# Patient Record
Sex: Male | Born: 1950
Health system: Southern US, Community
[De-identification: ages and names within clinical notes are randomized; demographics above are authoritative.]

## PROBLEM LIST (undated history)

## (undated) DIAGNOSIS — G4730 Sleep apnea, unspecified: Secondary | ICD-10-CM

## (undated) DIAGNOSIS — K635 Polyp of colon: Secondary | ICD-10-CM

## (undated) DIAGNOSIS — I4891 Unspecified atrial fibrillation: Secondary | ICD-10-CM

## (undated) DIAGNOSIS — K5010 Crohn's disease of large intestine without complications: Secondary | ICD-10-CM

## (undated) DIAGNOSIS — Z862 Personal history of diseases of the blood and blood-forming organs and certain disorders involving the immune mechanism: Secondary | ICD-10-CM

## (undated) DIAGNOSIS — K5790 Diverticulosis of intestine, part unspecified, without perforation or abscess without bleeding: Secondary | ICD-10-CM

## (undated) HISTORY — PX: FINGER SURGERY: SHX640

## (undated) HISTORY — DX: Diverticulosis of intestine, part unspecified, without perforation or abscess without bleeding: K57.90

## (undated) HISTORY — DX: Unspecified atrial fibrillation: I48.91

## (undated) HISTORY — DX: Polyp of colon: K63.5

## (undated) HISTORY — DX: Crohn's disease of large intestine without complications: K50.10

## (undated) HISTORY — PX: COLONOSCOPY: SHX174

## (undated) HISTORY — DX: Sleep apnea, unspecified: G47.30

## (undated) HISTORY — DX: Personal history of diseases of the blood and blood-forming organs and certain disorders involving the immune mechanism: Z86.2

---

## 1999-06-13 ENCOUNTER — Encounter (HOSPITAL_COMMUNITY): Admission: RE | Admit: 1999-06-13 | Discharge: 1999-09-11 | Payer: Self-pay | Admitting: Internal Medicine

## 1999-06-15 ENCOUNTER — Ambulatory Visit (HOSPITAL_COMMUNITY): Admission: RE | Admit: 1999-06-15 | Discharge: 1999-06-15 | Payer: Self-pay | Admitting: Internal Medicine

## 1999-06-15 ENCOUNTER — Encounter (INDEPENDENT_AMBULATORY_CARE_PROVIDER_SITE_OTHER): Payer: Self-pay | Admitting: Specialist

## 1999-06-15 ENCOUNTER — Encounter: Payer: Self-pay | Admitting: Internal Medicine

## 1999-06-30 ENCOUNTER — Encounter (INDEPENDENT_AMBULATORY_CARE_PROVIDER_SITE_OTHER): Payer: Self-pay | Admitting: Specialist

## 1999-06-30 ENCOUNTER — Inpatient Hospital Stay (HOSPITAL_COMMUNITY): Admission: EM | Admit: 1999-06-30 | Discharge: 1999-07-20 | Payer: Self-pay | Admitting: Emergency Medicine

## 1999-06-30 ENCOUNTER — Encounter: Payer: Self-pay | Admitting: Internal Medicine

## 1999-06-30 ENCOUNTER — Encounter (INDEPENDENT_AMBULATORY_CARE_PROVIDER_SITE_OTHER): Payer: Self-pay | Admitting: *Deleted

## 1999-07-01 ENCOUNTER — Encounter: Payer: Self-pay | Admitting: Internal Medicine

## 1999-07-10 ENCOUNTER — Encounter: Payer: Self-pay | Admitting: Internal Medicine

## 1999-07-13 ENCOUNTER — Encounter: Payer: Self-pay | Admitting: Internal Medicine

## 1999-07-14 ENCOUNTER — Encounter: Payer: Self-pay | Admitting: Internal Medicine

## 1999-08-19 ENCOUNTER — Ambulatory Visit (HOSPITAL_COMMUNITY): Admission: RE | Admit: 1999-08-19 | Discharge: 1999-08-19 | Payer: Self-pay | Admitting: Cardiology

## 1999-10-05 ENCOUNTER — Encounter: Payer: Self-pay | Admitting: Cardiology

## 1999-10-05 ENCOUNTER — Ambulatory Visit (HOSPITAL_COMMUNITY): Admission: RE | Admit: 1999-10-05 | Discharge: 1999-10-05 | Payer: Self-pay | Admitting: Cardiology

## 2000-12-26 ENCOUNTER — Encounter: Payer: Self-pay | Admitting: Internal Medicine

## 2000-12-26 ENCOUNTER — Encounter: Payer: Self-pay | Admitting: Gastroenterology

## 2000-12-26 ENCOUNTER — Observation Stay (HOSPITAL_COMMUNITY): Admission: EM | Admit: 2000-12-26 | Discharge: 2000-12-27 | Payer: Self-pay | Admitting: Emergency Medicine

## 2001-05-20 ENCOUNTER — Encounter (HOSPITAL_COMMUNITY): Admission: RE | Admit: 2001-05-20 | Discharge: 2001-08-18 | Payer: Self-pay | Admitting: Internal Medicine

## 2001-06-28 ENCOUNTER — Ambulatory Visit (HOSPITAL_COMMUNITY): Admission: RE | Admit: 2001-06-28 | Discharge: 2001-06-28 | Payer: Self-pay | Admitting: Internal Medicine

## 2001-09-25 ENCOUNTER — Encounter (HOSPITAL_COMMUNITY): Admission: RE | Admit: 2001-09-25 | Discharge: 2001-11-14 | Payer: Self-pay | Admitting: Internal Medicine

## 2001-12-19 ENCOUNTER — Encounter (HOSPITAL_COMMUNITY): Admission: RE | Admit: 2001-12-19 | Discharge: 2001-12-19 | Payer: Self-pay | Admitting: Internal Medicine

## 2002-01-30 ENCOUNTER — Encounter: Admission: RE | Admit: 2002-01-30 | Discharge: 2002-04-30 | Payer: Self-pay | Admitting: Internal Medicine

## 2002-06-04 ENCOUNTER — Encounter (HOSPITAL_COMMUNITY): Admission: RE | Admit: 2002-06-04 | Discharge: 2002-06-05 | Payer: Self-pay | Admitting: Internal Medicine

## 2002-07-31 ENCOUNTER — Encounter: Admission: RE | Admit: 2002-07-31 | Discharge: 2002-07-31 | Payer: Self-pay | Admitting: Internal Medicine

## 2002-09-17 ENCOUNTER — Encounter (HOSPITAL_COMMUNITY): Admission: RE | Admit: 2002-09-17 | Discharge: 2002-12-16 | Payer: Self-pay | Admitting: Internal Medicine

## 2002-09-23 ENCOUNTER — Encounter: Payer: Self-pay | Admitting: Internal Medicine

## 2003-01-06 ENCOUNTER — Encounter (HOSPITAL_COMMUNITY): Admission: RE | Admit: 2003-01-06 | Discharge: 2003-01-07 | Payer: Self-pay | Admitting: Internal Medicine

## 2003-03-09 ENCOUNTER — Encounter (HOSPITAL_COMMUNITY): Admission: RE | Admit: 2003-03-09 | Discharge: 2003-06-07 | Payer: Self-pay | Admitting: Internal Medicine

## 2003-07-21 ENCOUNTER — Encounter (HOSPITAL_COMMUNITY): Admission: RE | Admit: 2003-07-21 | Discharge: 2003-07-22 | Payer: Self-pay | Admitting: Internal Medicine

## 2003-09-15 ENCOUNTER — Encounter (HOSPITAL_COMMUNITY): Admission: RE | Admit: 2003-09-15 | Discharge: 2003-11-13 | Payer: Self-pay | Admitting: Internal Medicine

## 2003-11-17 ENCOUNTER — Encounter (HOSPITAL_COMMUNITY): Admission: RE | Admit: 2003-11-17 | Discharge: 2004-02-13 | Payer: Self-pay | Admitting: Internal Medicine

## 2004-02-16 ENCOUNTER — Encounter (HOSPITAL_COMMUNITY): Admission: RE | Admit: 2004-02-16 | Discharge: 2004-05-16 | Payer: Self-pay | Admitting: Internal Medicine

## 2004-02-29 ENCOUNTER — Ambulatory Visit: Payer: Self-pay | Admitting: Internal Medicine

## 2004-03-09 ENCOUNTER — Ambulatory Visit: Payer: Self-pay | Admitting: Internal Medicine

## 2004-07-05 ENCOUNTER — Encounter (HOSPITAL_COMMUNITY): Admission: RE | Admit: 2004-07-05 | Discharge: 2004-07-06 | Payer: Self-pay | Admitting: Internal Medicine

## 2004-08-31 ENCOUNTER — Encounter (HOSPITAL_COMMUNITY): Admission: RE | Admit: 2004-08-31 | Discharge: 2004-11-29 | Payer: Self-pay | Admitting: Internal Medicine

## 2004-11-01 ENCOUNTER — Ambulatory Visit: Payer: Self-pay | Admitting: Internal Medicine

## 2004-11-08 ENCOUNTER — Ambulatory Visit: Payer: Self-pay | Admitting: Internal Medicine

## 2004-11-08 ENCOUNTER — Encounter (INDEPENDENT_AMBULATORY_CARE_PROVIDER_SITE_OTHER): Payer: Self-pay | Admitting: Specialist

## 2005-01-06 ENCOUNTER — Encounter (HOSPITAL_COMMUNITY): Admission: RE | Admit: 2005-01-06 | Discharge: 2005-04-06 | Payer: Self-pay | Admitting: Internal Medicine

## 2005-04-28 ENCOUNTER — Encounter (HOSPITAL_COMMUNITY): Admission: RE | Admit: 2005-04-28 | Discharge: 2005-07-27 | Payer: Self-pay | Admitting: Internal Medicine

## 2005-05-16 ENCOUNTER — Ambulatory Visit: Payer: Self-pay | Admitting: Internal Medicine

## 2005-09-11 ENCOUNTER — Encounter (HOSPITAL_COMMUNITY): Admission: RE | Admit: 2005-09-11 | Discharge: 2005-12-10 | Payer: Self-pay | Admitting: Internal Medicine

## 2006-01-16 ENCOUNTER — Encounter (HOSPITAL_COMMUNITY): Admission: RE | Admit: 2006-01-16 | Discharge: 2006-04-16 | Payer: Self-pay | Admitting: Internal Medicine

## 2006-04-27 ENCOUNTER — Encounter (HOSPITAL_COMMUNITY): Admission: RE | Admit: 2006-04-27 | Discharge: 2006-05-11 | Payer: Self-pay | Admitting: Internal Medicine

## 2006-06-21 ENCOUNTER — Encounter (HOSPITAL_COMMUNITY): Admission: RE | Admit: 2006-06-21 | Discharge: 2006-07-14 | Payer: Self-pay | Admitting: Internal Medicine

## 2006-12-18 ENCOUNTER — Ambulatory Visit: Payer: Self-pay | Admitting: Internal Medicine

## 2006-12-28 ENCOUNTER — Ambulatory Visit: Payer: Self-pay | Admitting: Internal Medicine

## 2006-12-28 ENCOUNTER — Encounter: Payer: Self-pay | Admitting: Internal Medicine

## 2007-06-20 ENCOUNTER — Ambulatory Visit: Payer: Self-pay | Admitting: Internal Medicine

## 2007-07-11 ENCOUNTER — Telehealth: Payer: Self-pay | Admitting: Internal Medicine

## 2008-03-02 ENCOUNTER — Encounter (INDEPENDENT_AMBULATORY_CARE_PROVIDER_SITE_OTHER): Payer: Self-pay | Admitting: *Deleted

## 2008-05-06 ENCOUNTER — Telehealth: Payer: Self-pay | Admitting: Internal Medicine

## 2008-08-18 ENCOUNTER — Encounter: Payer: Self-pay | Admitting: Internal Medicine

## 2008-09-16 ENCOUNTER — Telehealth: Payer: Self-pay | Admitting: Internal Medicine

## 2008-10-07 ENCOUNTER — Ambulatory Visit: Payer: Self-pay | Admitting: Internal Medicine

## 2008-10-07 DIAGNOSIS — K50119 Crohn's disease of large intestine with unspecified complications: Secondary | ICD-10-CM | POA: Insufficient documentation

## 2008-10-08 ENCOUNTER — Ambulatory Visit: Payer: Self-pay | Admitting: Internal Medicine

## 2008-10-08 ENCOUNTER — Encounter: Payer: Self-pay | Admitting: Internal Medicine

## 2008-10-12 ENCOUNTER — Encounter: Payer: Self-pay | Admitting: Internal Medicine

## 2008-10-23 ENCOUNTER — Telehealth (INDEPENDENT_AMBULATORY_CARE_PROVIDER_SITE_OTHER): Payer: Self-pay | Admitting: *Deleted

## 2008-11-12 ENCOUNTER — Telehealth: Payer: Self-pay | Admitting: Internal Medicine

## 2008-11-25 ENCOUNTER — Telehealth: Payer: Self-pay | Admitting: Internal Medicine

## 2008-11-26 ENCOUNTER — Telehealth (INDEPENDENT_AMBULATORY_CARE_PROVIDER_SITE_OTHER): Payer: Self-pay | Admitting: *Deleted

## 2008-12-10 ENCOUNTER — Encounter: Payer: Self-pay | Admitting: Internal Medicine

## 2008-12-23 ENCOUNTER — Telehealth: Payer: Self-pay | Admitting: Internal Medicine

## 2009-02-04 ENCOUNTER — Encounter: Payer: Self-pay | Admitting: Internal Medicine

## 2009-02-18 ENCOUNTER — Telehealth: Payer: Self-pay | Admitting: Internal Medicine

## 2009-04-08 ENCOUNTER — Encounter: Payer: Self-pay | Admitting: Internal Medicine

## 2009-05-24 ENCOUNTER — Telehealth: Payer: Self-pay | Admitting: Internal Medicine

## 2009-05-24 ENCOUNTER — Encounter: Payer: Self-pay | Admitting: Internal Medicine

## 2009-05-25 ENCOUNTER — Ambulatory Visit: Payer: Self-pay | Admitting: Internal Medicine

## 2009-06-03 ENCOUNTER — Encounter: Payer: Self-pay | Admitting: Internal Medicine

## 2009-06-03 LAB — CONVERTED CEMR LAB
ALT: 22 units/L (ref 0–53)
AST: 18 units/L (ref 0–37)
Albumin: 4.1 g/dL (ref 3.5–5.2)
Alkaline Phosphatase: 71 units/L (ref 39–117)
BUN: 13 mg/dL (ref 6–23)
Basophils Absolute: 0 10*3/uL (ref 0.0–0.1)
Basophils Relative: 0.9 % (ref 0.0–3.0)
Bilirubin, Direct: 0.1 mg/dL (ref 0.0–0.3)
CO2: 29 meq/L (ref 19–32)
Calcium: 9.1 mg/dL (ref 8.4–10.5)
Chloride: 107 meq/L (ref 96–112)
Creatinine, Ser: 0.8 mg/dL (ref 0.4–1.5)
Eosinophils Absolute: 0.1 10*3/uL (ref 0.0–0.7)
Eosinophils Relative: 2.5 % (ref 0.0–5.0)
GFR calc non Af Amer: 105.94 mL/min (ref >60)
Glucose, Bld: 85 mg/dL (ref 70–99)
HCT: 42 % (ref 39.0–52.0)
Hemoglobin: 14.5 g/dL (ref 13.0–17.0)
Lymphocytes Relative: 35.6 % (ref 12.0–46.0)
Lymphs Abs: 1.8 10*3/uL (ref 0.7–4.0)
MCHC: 34.5 g/dL (ref 30.0–36.0)
MCV: 91.1 fL (ref 78.0–100.0)
Monocytes Absolute: 0.7 10*3/uL (ref 0.1–1.0)
Monocytes Relative: 13.2 % — ABNORMAL HIGH (ref 3.0–12.0)
Neutro Abs: 2.5 10*3/uL (ref 1.4–7.7)
Neutrophils Relative %: 47.8 % (ref 43.0–77.0)
Platelets: 313 10*3/uL (ref 150.0–400.0)
Potassium: 4.2 meq/L (ref 3.5–5.1)
RBC: 4.61 M/uL (ref 4.22–5.81)
RDW: 13.2 % (ref 11.5–14.6)
Sodium: 142 meq/L (ref 135–145)
Total Bilirubin: 0.8 mg/dL (ref 0.3–1.2)
Total Protein: 7.1 g/dL (ref 6.0–8.3)
WBC: 5.2 10*3/uL (ref 4.5–10.5)

## 2009-06-17 ENCOUNTER — Encounter: Payer: Self-pay | Admitting: Internal Medicine

## 2009-07-29 ENCOUNTER — Encounter: Payer: Self-pay | Admitting: Internal Medicine

## 2009-09-21 ENCOUNTER — Encounter: Payer: Self-pay | Admitting: Internal Medicine

## 2009-09-21 ENCOUNTER — Telehealth (INDEPENDENT_AMBULATORY_CARE_PROVIDER_SITE_OTHER): Payer: Self-pay | Admitting: *Deleted

## 2009-09-23 ENCOUNTER — Encounter: Payer: Self-pay | Admitting: Internal Medicine

## 2009-10-13 ENCOUNTER — Encounter: Payer: Self-pay | Admitting: Internal Medicine

## 2009-10-13 ENCOUNTER — Telehealth: Payer: Self-pay | Admitting: Internal Medicine

## 2009-10-14 ENCOUNTER — Encounter: Payer: Self-pay | Admitting: Internal Medicine

## 2009-11-18 ENCOUNTER — Encounter: Payer: Self-pay | Admitting: Internal Medicine

## 2009-11-24 ENCOUNTER — Ambulatory Visit: Payer: Self-pay | Admitting: Internal Medicine

## 2010-01-13 ENCOUNTER — Encounter: Payer: Self-pay | Admitting: Internal Medicine

## 2010-03-04 ENCOUNTER — Encounter: Payer: Self-pay | Admitting: Internal Medicine

## 2010-03-07 ENCOUNTER — Telehealth (INDEPENDENT_AMBULATORY_CARE_PROVIDER_SITE_OTHER): Payer: Self-pay | Admitting: *Deleted

## 2010-03-10 ENCOUNTER — Encounter: Payer: Self-pay | Admitting: Internal Medicine

## 2010-03-15 NOTE — Miscellaneous (Signed)
Summary: TB Skin Test Reading  Clinical Lists Changes  Observations: Added new observation of TB-PPD: 09/23/09 41m/neg. (10/13/2009 16:45)

## 2010-03-15 NOTE — Progress Notes (Signed)
   Phone Note Outgoing Call   Call placed by: Randye Lobo [place],  October 13, 2009 4:06 PM Call placed to: Patient Summary of Call: Called patient to let him know we did not receive the TB Skin Test results yet.  He stated he would call [facility] one more time and if not satisfied, will personally go to them and pick it up and drop it off himself.   Initial call taken by: Randye Lobo NCMA,  October 13, 2009 4:08 PM

## 2010-03-15 NOTE — Op Note (Signed)
Summary: [facility]  [facility]   Imported By: Phillis Knack 06/11/2009 09:22:09  _____________________________________________________________________  External Attachment:    Type:   Image     Comment:   External Document

## 2010-03-15 NOTE — Letter (Signed)
Summary: [place]   Imported By: Phillis Knack 11/30/2009 10:51:43  _____________________________________________________________________  External Attachment:    Type:   Image     Comment:   External Document

## 2010-03-15 NOTE — Progress Notes (Signed)
Summary: enrollment form   Phone Note Call from Patient Call back at Home Phone [phone]   Caller: Patient Call For: Dr. Henrene Pastor Reason for Call: Talk to Nurse Summary of Call: pt would like a [facility] enrollment application filled out by Dr. Henrene Pastor Initial call taken by: Lucien Mons,  May 24, 2009 10:22 AM  Follow-up for Phone Call        Pt. is going to bring Remi-start form tomorrow to be signed by Dr.Neill Jurewicz. He has not had any blood work since starting Remicade at [facility].Will get it drawn tomorrow if you want him to have it done. Follow-up by: Abel Presto RN,  May 24, 2009 10:45 AM  Additional Follow-up for Phone Call Additional follow up Details #1::        cbc, cmet Additional Follow-up by: Irene Shipper MD,  May 24, 2009 11:45 AM    Additional Follow-up for Phone Call Additional follow up Details #2::    Pt. will have labs drawn tomorrow. Follow-up by: Abel Presto RN,  May 24, 2009 1:52 PM

## 2010-03-15 NOTE — Op Note (Signed)
Summary: [facility]  [facility]   Imported By: Phillis Knack 06/11/2009 09:21:23  _____________________________________________________________________  External Attachment:    Type:   Image     Comment:   External Document

## 2010-03-15 NOTE — Progress Notes (Signed)
   Phone Note Outgoing Call   Summary of Call: Pt. ntfd. that yearly TB test for Remicade is due on 10/07/09 .He is going to ask them to do it at [facility] and if they can't do it he will call back her  to schedule it. Initial call taken by: Abel Presto RN,  September 21, 2009 8:50 AM

## 2010-03-15 NOTE — Op Note (Signed)
Summary: [facility]  [facility]   Imported By: Phillis Knack 06/11/2009 09:19:19  _____________________________________________________________________  External Attachment:    Type:   Image     Comment:   External Document

## 2010-03-15 NOTE — Miscellaneous (Signed)
Summary: PPD reading  Clinical Lists Changes  Observations: Added new observation of TB-PPD: 09/23/09 (10/14/2009 7:59)  Appended Document: PPD reading nxt due 09/14/10

## 2010-03-15 NOTE — Op Note (Signed)
Summary: [facility]  [facility]   Imported By: Phillis Knack 06/11/2009 09:20:26  _____________________________________________________________________  External Attachment:    Type:   Image     Comment:   External Document

## 2010-03-15 NOTE — Op Note (Signed)
Summary: [facility]  [facility]   Imported By: Phillis Knack 08/05/2009 08:56:59  _____________________________________________________________________  External Attachment:    Type:   Image     Comment:   External Document

## 2010-03-15 NOTE — Op Note (Signed)
Summary: [facility]  [facility]   Imported By: Phillis Knack 10/01/2009 14:38:22  _____________________________________________________________________  External Attachment:    Type:   Image     Comment:   External Document

## 2010-03-15 NOTE — Op Note (Signed)
Summary: [facility]ade/[facility] [place] By: Phillis Knack 01/17/2010 14:25:07  _____________________________________________________________________  External Attachment:    Type:   Image     Comment:   External Document

## 2010-03-15 NOTE — Op Note (Signed)
Summary: [facility] [place] By: Phillis Knack 11/30/2009 10:49:28  _____________________________________________________________________  External Attachment:    Type:   Image     Comment:   External Document

## 2010-03-15 NOTE — Letter (Deleted)
Summary: Enrollment form for Remicade/[facility]  Enrollment form for Remicade/[facility]   Imported By: Phillis Knack 05/31/2009 09:19:40  _____________________________________________________________________  External Attachment:    Type:   Image     Comment:   External Document

## 2010-03-15 NOTE — Progress Notes (Signed)
Summary: speak to nurse   Phone Note Call from Patient Call back at Home Phone [phone]   Caller: Patient Call For: Henrene Pastor Reason for Call: Talk to Nurse Summary of Call: Patient needs to speak directly to nurse Initial call taken by: Ronalee Red,  February 18, 2009 12:51 PM  Follow-up for Phone Call        Pt. is trying to get his rebate from [facility] and they will be sending a form that Dr.Perry needs to complete .Will send to Sylvia as I will be out of the office until next Tuesday. Follow-up by: Abel Presto RN,  February 18, 2009 2:36 PM    Additional Follow-up for Phone Call Additional follow up Details #2::    Form rec'd from [facility] but pt. contacted and told the info. they need will have to be provided by [facility] as they administered the tx. so [place] is going to contact them. Follow-up by: Abel Presto RN,  February 22, 2009 3:03 PM

## 2010-03-17 ENCOUNTER — Encounter: Payer: Self-pay | Admitting: Internal Medicine

## 2010-03-23 NOTE — Miscellaneous (Signed)
Summary: Remicade order  Clinical Lists Changes  Orders: Added new Test order of Rem[facility]ade Infusion (Remicade) - Signed

## 2010-03-23 NOTE — Progress Notes (Signed)
Summary: Returning your call   Phone Note Outgoing Call   Summary of Call: Message left for pt. to call back [id number] rec'd. from [place] [id number]. Initial call taken by: Abel Presto RN,  March 07, 2010 3:50 PM  Follow-up for Phone Call        Pt. is not familiar with form rec'd. so he will talk to [employer]  and I  will inquire about what  info they need from Dr.Perry.     Abel Presto RN  March 08, 2010 3:33 PM  [phone] Wants to speak to [place] today. Irwin Brakeman [facility]  March 14, 2010 9:06 AM   Additional Follow-up for Phone Call Additional follow up Details #1::        Spoke with patient and all available records from our office were faxed to [facility]. Patient instructed to call [facility] to have his other records sent. Patient states he will call them this afternoon. Additional Follow-up by: Rosanne Sack RN,  March 15, 2010 10:04 AM

## 2010-03-23 NOTE — Op Note (Signed)
Summary: Infusion[facility]  Infusion/[facility] [place] By: Bubba Hales 03/18/2010 10:28:20  _____________________________________________________________________  External Attachment:    Type:   Image     Comment:   External Document

## 2010-03-30 ENCOUNTER — Telehealth: Payer: Self-pay | Admitting: Internal Medicine

## 2010-03-31 NOTE — Medication Information (Signed)
Summary: Enrollment form for Remicade/[facility]  Enrollment form for Remicade/[facility]   Imported By: Phillis Knack 05/31/2009 09:19:40  _____________________________________________________________________  External Attachment:    Type:   Image     Comment:   External Document

## 2010-03-31 NOTE — Medication Information (Signed)
Summary: Remicade Benefits  Remicade Benefits   Imported By: Bubba Hales 03/23/2010 09:28:39  _____________________________________________________________________  External Attachment:    Type:   Image     Comment:   External Document

## 2010-04-07 ENCOUNTER — Encounter: Payer: Self-pay | Admitting: Internal Medicine

## 2010-04-21 NOTE — Progress Notes (Signed)
Summary: Prior auth for Remicade   Phone Note From Other Clinic   Caller: Tonya @ [facility] infusion center [phone] Call For: Dr Henrene Pastor Reason for Call: Schedule Patient Appt Summary of Call: Pt needs prior auth for his infusion  Initial call taken by: Irwin Brakeman [facility],  March 30, 2010 10:38 AM  Follow-up for Phone Call        Juliann Pulse from [facility] says a prior  authorization approval  letter should have been sent to [place] for pt's.remicade infusion.No approval document is in chart. I re-faxed info. and put note on it that pt. can't be scheduled for next infusion until the document is rec'd from them and faxed to [place].Will need to f/u with phone call to insurance if letter is not rec'd. soon per Juliann Pulse.  Follow-up by: Abel Presto RN,  April 01, 2010 2:37 PM  Additional Follow-up for Phone Call Additional follow up Details #1::        Spoke with [place] at [phone] regarding patient and his Remicade. Per [employer] patient does not need prior-auth for his Remicade as long as he has diagnosis of Crohns disease. Requested Ins co fax a letter stating this, letter should be received tomorrow afternoon. At that point it needs to be faxed to [place]. Additional Follow-up by: Rosanne Sack RN,  April 05, 2010 10:12 AM    Additional Follow-up for Phone Call Additional follow up Details #2::    Spoke with Ivin Booty today, have called multiple times and have been told that the letter is being faxed. Have not received the letter. She assures me we will get a follow-up call on Monday regarding the letter. Follow-up by: Rosanne Sack RN,  April 08, 2010 11:51 AM  Additional Follow-up for Phone Call Additional follow up Details #3:: Details for Additional Follow-up Action Taken: Letter came in from insurance company today, faxed a copy of it to [employer]. Additional Follow-up by: Rosanne Sack RN,  April 11, 2010 1:02  PM

## 2010-04-21 NOTE — Medication Information (Signed)
Summary: Remicade/[facility]  [facility]ade/[facility]   Imported By: Phillis Knack 04/13/2010 09:50:44  _____________________________________________________________________  External Attachment:    Type:   Image     Comment:   External Document

## 2010-06-13 ENCOUNTER — Telehealth: Payer: Self-pay | Admitting: Internal Medicine

## 2010-06-14 NOTE — Telephone Encounter (Signed)
Received form and faxed it back to [place].

## 2010-06-28 NOTE — Assessment & Plan Note (Signed)
[facility]                         GASTROENTEROLOGY OFFICE NOTE   KEIANDRE, CYGAN                        MRN:          [id number]  DATE:06/20/2007                            DOB:          [birth year]    HISTORY:  Donald Duncan presents today for followup regarding ongoing management  of his indeterminate colitis, felt most likely Crohn disease.  For this,  he is maintained on Remicade 5 mg/kg IV every 8 weeks.  He receives this  at Dr. Marcello Moores Rowe's office.  He has had no problems.  His last office  visit was May 16, 2005.  Laboratories at that time were unremarkable.  His last surveillance colonoscopy was December 28, 2006.  At that time,  random biopsies throughout revealed no evidence of active colitis.  He  did have pseudopolyposis.  Since that time he has done well.  No  abdominal pain, change in bowel habits, bleeding, diarrhea, fevers.  He  denies any extra-intestinal manifestations of inflammatory bowel disease  such as visual disturbance, joint aches, or relevant skin disorders.   He continues to work.  He is concerned, however, that his insurance may  be in jeopardy as his wife just lost her job of 13 years.   He has no known drug allergies.   His only medications are Remicade, Vitamin D, and multivitamin.   PHYSICAL EXAMINATION:  GENERAL:  Finds a well-appearing male in no acute  distress.  VITAL SIGNS:  His blood pressure is 110/66, heart rate 72, weight is 169  pounds.  HEENT:  Sclerae are anicteric.  Conjunctivae are pink.  Oral mucosa is  intact.  No adenopathy.  LUNGS:  Clear.  HEART:  Regular.  ABDOMEN:  Soft without tenderness, mass, or hernia.  EXTREMITIES:  Without edema.  NEUROLOGIC:  He is intact without focal deficit.   IMPRESSION:  Indeterminate colitis, felt likely Crohn's, with excellent  ongoing response to Remicade infusion therapy.   RECOMMENDATIONS:  1. Continue Remicade infusion therapy.  2. Plans for followup  surveillance next year.  3. Contact the office in the interim for any questions or problems.     Docia Chuck. Henrene Pastor, MD  Electronically Signed    JNP/MedQ  DD: 06/20/2007  DT: 06/20/2007  Job #: [id number]   cc:   Michael Litter, M.D.  @ [place]

## 2010-06-29 ENCOUNTER — Encounter: Payer: Self-pay | Admitting: Internal Medicine

## 2010-06-29 ENCOUNTER — Ambulatory Visit (INDEPENDENT_AMBULATORY_CARE_PROVIDER_SITE_OTHER): Payer: PRIVATE HEALTH INSURANCE | Admitting: Internal Medicine

## 2010-06-29 VITALS — BP 128/72 | HR 80 | Ht 74.0 in | Wt 197.0 lb

## 2010-06-29 DIAGNOSIS — K5010 Crohn's disease of large intestine without complications: Secondary | ICD-10-CM

## 2010-06-29 NOTE — Patient Instructions (Signed)
Follow-up as needed. You will receive a letter regarding your Colonoscopy around August of this year.

## 2010-06-29 NOTE — Progress Notes (Signed)
HISTORY OF PRESENT ILLNESS:  Donald Duncan is a 60 y.o. 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 For this, he is maintained on Remicade 5 mg per kilogram IV every 8 weeks, has a solo agent. He receives this at an outpatient center in [facility]. Correspondence from that center finds that his confusion sessions are unremarkable. He has routine blood work, most recently CBC and comprehensive metabolic panel January 6440. This has been normal. His last TB testing, which was negative, was performed in August 2011. He was last seen in this office August 2010. That's a month he underwent surveillance colonoscopy. He was found to have quiescent colitis with pseudopolyps, mostly in the left colon. Biopsies throughout the colon were taken and revealed benign colonic mucosa with out evidence of dysplasia. Since that visit he has done well. No evidence for colitis flare. No new medical problems. Nothing to suggest extracolonic manifestations of inflammatory bowel disease. His GI review of systems is negative.  REVIEW OF SYSTEMS:  All non-GI ROS negative.  Past Medical History  Diagnosis Date  . Crohn's colitis   . Diverticulosis   . Hemorrhoids   . Colon polyp, hyperplastic   . H/O: iron deficiency anemia     Past Surgical History  Procedure Date  . Finger surgery     Thumb     Social History Donald Duncan  reports that he has quit smoking. He has never used smokeless tobacco. He reports that he does not drink alcohol or use illicit drugs.  family history includes Heart disease in his mother and Lung cancer in his father.  There is no history of Colon cancer.  No Known Allergies     PHYSICAL EXAMINATION: Vital signs: BP 128/72  Pulse 80  Ht 6' 2"  (1.88 m)  Wt 197 lb (89.359 kg)  BMI 25.29 kg/m2  Constitutional: generally well-appearing, no acute distress Psychiatric: alert and oriented x3, cooperative Eyes: extraocular movements intact, anicteric,  conjunctiva pink Mouth: oral pharynx moist, no lesions Neck: supple no lymphadenopathy Cardiovascular: heart regular rate and rhythm, no murmur Lungs: clear to auscultation bilaterally Abdomen: soft, nontender, nondistended, no obvious ascites, no peritoneal signs, normal bowel sounds, no organomegaly Rectal: Deferred Extremities: no lower extremity edema bilaterally Skin: no lesions on visible extremities Neuro: No focal deficits.   ASSESSMENT:  #1. Indeterminate colitis, likely Crohn's. His disease remains clinically quiescent on Remicade therapy   PLAN:  #1. Continue Remicade 5 mg per kilogram IV every 8 weeks #2. Next TB testing per the infusion Center around August 2012 #3. Due for surveillance colonoscopy around August 2012.The nature of the procedure, as well as the risks, benefits, and alternatives were carefully and thoroughly reviewed with the patient. Ample time for discussion and questions allowed. The patient understood, was satisfied, and agreed to proceed.  #4. Routine office followup in 1 year. Sooner for questions or problems

## 2010-07-01 NOTE — Procedures (Signed)
[facility]  Patient:    Donald Duncan, Donald Duncan                        MRN: [id number] Proc. Date: 06/15/99 Adm. Date:  [id number] Disc. Date: [id number] Attending:  Neita Garnet CC:         Sherren Kerns. Pamella Pert, M.D.                           Procedure Report  PROCEDURES:  Colonoscopy with biopsies.  INDICATIONS:  Abdominal pain, diarrhea, and rectal bleeding.  HISTORY:  This is a 60 year old gentleman who was evaluated in the office two days ago for acute diarrhea, lower abdominal pain, fever, and rectal bleeding.  He was felt to have acute colitis, type unknown.  He was given a liter of IV fluids and placed on metronidazole 250 mg q.i.d.  He and his wife both report significant improvement in his condition, though symptoms persistent to a lesser degree. He is now for colonoscopy.  The nature of the procedure, as well as the risks, benefits, and alternatives were reviewed.  He understood and agreed to proceed.  PHYSICAL EXAMINATION:  An ill-appearing male in no acute distress.  He is alert and oriented.  Vital signs are stable.  The lungs are clear.  The heart is regular.  The abdomen is soft with good bowel sounds.  No tenderness.  This is improved from his exam of two days ago.  DESCRIPTION OF PROCEDURE:  After informed consent was obtained, the patient was  sedated with 80 mg of Demerol and 9 mg of Versed IV.  A digital rectal was performed and found to be unremarkable.  The Olympus colonoscope was then passed under direct vision per rectum and advanced through the entire length of the colon to the cecum tip.  The ileum was intubated and appeared normal.  The colon, however, was grossly abnormal throughout.  Diffuse mucosal colitis existed. This was manifested by the presence of diffuse granularity and friability.  In addition, several regions revealed deep punched out ulcers in the colon.  These were most  prominent in the rectosigmoid  region.  Multiple biopsies of the colon were taken throughout.  In addition, stool was collected in a container and submitted for oth ova and parasites, as well as Clostridium difficile toxin.  Finally, biopsies and stool were submitted for viral cultures.  IMPRESSION:  Acute diffuse mucosal colitis of uncertain cause.  Possible etiologies include idiopathic ulcerative colitis versus infectious (i.e. pseudomembranous colitis, cytomegalovirus induced, or amoeba).  RECOMMENDATIONS: 1. Follow-up biopsies. 2. Follow-up viral cultures. 3. Follow-up stool studies for ova, parasites, and Clostridium difficile toxin. 4. Continue metronidazole. 5. Add Asacol 1.6 g t.i.d. 6. An additional liter of IV fluids at this time. 7. Office visit with myself on Jun 20, 1999, at 11:30 a.m.  If there are interval    questions or problems, they have been instructed to call. DD:  06/15/99 TD:  06/17/99 Job: [id number]

## 2010-07-01 NOTE — Op Note (Signed)
[facility]  Patient:    Donald Duncan, Donald Duncan                        MRN: [id number] Adm. Date:  [id number] Attending:  Vincent Peyer CC:         John ________, M.D.                           Operative Report  PROCEDURE:  Flexible sigmoidoscopy.  ENDOSCOPIST:  Lowella Bandy. Olevia Perches, M.D.  INDICATION:  This 60 year old gentleman was admitted approximately one week ago with severe diarrhea and diagnosis of ulcerative colitis.  He failed outpatient therapy and has been now hospitalized following bowel rest with central TPN, high-dose steroids, and mesalamine.  He has made some progress but clinically decision has centered around the possibility of advancing his diet.  He has still had some diarrhea.  He is now undergoing flexible sigmoidoscopy to assess the endoscopic improvement or partial resolution of his inflammatory bowel disease.  ENDOSCOPE:  Olympus single-channel videoscope.  SEDATION:  Versed 9 mg IV, Demerol 100 mg IV.  FINDINGS:  Olympus single-channel videoscope passed into entrance of the rectum to sigmoid colon.  Patient was monitored by pulse oximetry.  His oxygen saturations were normal between 97 to 99%.  Patient received Fleet enema prior to procedure.  The rectal sphincter was hypertensive.  There was very painful insertion of the scope into the rectal ampulla which showed a large anorectal ulceration which was deep, measured about 1-2 cm in diameter.  It was partially involving the anal canal and rectal ampulla.  It was bleeding on touch with the scope, in fact, it must have been injured by the tip of the enema, because it showed some stigmata of injury.  The patient had a lot of discomfort as the endoscope traversed the sphincter.  The rest of the rectal ampulla was essentially unremarkable with normal-appearing mucosa up to about 15 cm from the rectum.  Above 15 cm, the patient had multiple aphthous ulcers with edematous mucosa,  pseudopolyps, and large serpiginous ulcerations ________ by small circular aphthous ulcerations.  These changes were extending through the entire left colon up to the level of 70 cm.  The procedure was terminated.  The lumen was partially obstructed because of edema and spasm of the colon.  There was some bleeding from the orifice of some of the ulcerations.  Biopsies were taken from the edges of the ulcers to rule out granulomas.  IMPRESSION:  Acute colitis consistent with Crohns disease, with relative sparing of the rectal sigmoid and a large anorectal ulcer, status post biopsies.  PLAN: 1. Add Flagyl 250 mg p.o. t.i.d. for rectal disease.  The patient may tolerate    Flagyl better now than several weeks ago. 2. Add 6-mercaptopurine 50 mg p.o. q.d. 3. Consider Remicade treatment. 4. Advance to full liquid diet to see if the patient tolerates it. DD:  07/06/99 TD:  07/10/99 Job: [id number]

## 2010-07-01 NOTE — Discharge Summary (Signed)
[facility]  Patient:    CARR, SHARTZER                        MRN: [id number] Adm. Date:  [id number] Disc. Date: [id number] Attending:  Vincent Peyer Dictator:   Nicoletta Ba, P.A. CC:         Docia Chuck. Geri Seminole., M.D. [facility]             Terald Sleeper, M.D.                           Discharge Summary  ADMITTING DIAGNOSES: 1. New-onset ulcerative colitis refractory to outpatient management, rule out    bacteremia, rule out toxic megacolon. 2. Malnutrition secondary to #1. 3. Dehydration secondary to marked diarrhea.  DISCHARGE DIAGNOSES: 1. Refractory inflammatory bowel disease, most consistent with Crohns    colitis, improving. 2. New-onset atrial fibrillation. 3. Left atrial thrombus. 4. Iron deficiency anemia secondary to #1. 5. Malnutrition secondary to #1 requiring prolonged bowel rest.  CONSULTATIONS:  Cardiology with Dr. Stanford Breed and Dr. Dannielle Burn.  PROCEDURES: 1. Small bowel follow through. 2. Transesophageal echocardiogram. 3. Flexible sigmoidoscopy on Jun 30, 1999.  HISTORY OF PRESENT ILLNESS:  Samyak is a very nice, previously generally healthy, 60 year old white male with no known chronic medical problems.  He has sustained an injury to his thumb earlier in the Spring of 2001, and required surgical repair as well as antibiotics.  At this point, he says he has been sick over the past two months since that time initially with gradual onset of diarrhea and abdominal cramping followed by diarrhea with bright red blood.  He had been evaluated by Dr. Henrene Pastor as an outpatient and had undergone colonoscopy.  On April 30, he was found to have evidence of pan colitis which was felt consistent with ulcerative colitis.  He was initially treated with Asacol and Flagyl which he did not tolerate well due to nausea and stopped this after about four days of treatment.  Over the past two weeks, he has been on high-dose prednisone at 60 mg per day as well  as high-dose Asacol 1600 mg t.i.d. and had been started on Questran due to severe complaints of diarrhea. None of this has been helpful.  He has had progressive decline in his energy level and his weight with poor appetite and had lost a total of about 18 pounds.  He has also had some intermittent fevers.  The day prior to admission, he was up to 102 with associated chills.  He has been eating very little as he said he is "scared to eat" due to increased abdominal cramping and diarrhea with any p.o. intake.  He was seen and evaluated at this time by Dr. Delfin Edis in the emergency room and admitted to the hospital with refractory ulcerative colitis for bowel rest and aggressive medical therapy.  LABORATORY DATA AND X-RAY FINDINGS:  On May 17, WBC was 11.0, hemoglobin 12.1, hematocrit 36.5, MCV 88.7, platelet count 631.  On June 4, WBC was 10.8, hemoglobin 11.5, hematocrit 35.2 and platelets 278.  He required anticoagulation during this admission.  On June 5, PT was 18.2, INR 1.9.  On June 6, INR was 2.2 with PT 19.5.  Sedimentation rate on May 18, was 78. Electrolytes on admission showed a sodium of 136, potassium 4.2, BUN 12, creatinine 0.9.  Albumin 2.1.  Liver function studies were normal.  Amylase and  lipase also normal.  Serial values were obtained and on June 4, sodium 135, potassium 4.0, BUN 18, creatinine 0.6.  Total protein 4.7, albumin 2.3. LFTs showed Alk phos 119, SGPT 44, SGOT 18.  Magnesium 2.1, phosphorus 2.8. CK-MBs were drawn on May 21, which were negative x 2.  Cholesterol 135, triglycerides 85.  TSH was 1.5.  Serum iron 10, TIBC 175, iron saturation of 6.  B12 643.  Folate 9.9.  Prealbumin drawn on May 18, was 13.2.  Follow up on June 4, was 56.1.  Stool for C. difficile was negative.  Stool culture negative.  Colon biopsies from May 23, showed left colon with chronic active colitis consistent with inflammatory bowel disease.  There was a polyp at 20 cm showing inflamed  mucosa consistent with an inflammatory polyp.  There was noted to be somewhat patchy distribution and evidence of deep penetrating ulcers favoring Crohns over ulcerative colitis, no dysplasia.  A small bowel follow through on May 30, was normal.  Chest x-ray on May 17, showed evidence of COPD, otherwise no acute abnormality.  HOSPITAL COURSE:  The patient has been admitted to the service of Dr. Delfin Edis who was covering the hospital.  He was initially placed at bowel rest and on IV Solu-Medrol as well high-dose Asacol.  Stool cultures were obtained all of which were negative.  He was covered empirically with IV Cipro.  We also obtained blood cultures which were negative.  The patient had been refractory to outpatient management with high-dose prednisone.  It was felt that he was going to require bowel rest prolonged and TNA.  We did have PICC line placed on May 18, and he was begun on TNA.  He really failed to have any improvement over the next week or so.  By May 21, we had tried to start advancing his diet.  He was also noted on May 21, to have some chest pain and tachycardia and found to be in atrial fibrillation/flutter.  Cardiology was consulted and it was felt that he was going to require anticoagulation and was started on Coumadin and heparin.  Initial plans were for inpatient cardioversion.  He remained stable for a cardiac standpoint over the next week.  Again, we made little progress from a GI standpoint and had to back up on his diet once again.  He did undergo flexible sigmoidoscopy with Dr. Olevia Perches on May 23, which showed a huge rectal ulcer and patchy distribution of deep colonic ulcers in the left colon.  She felt that this picture was more consistent with a Crohns disease and was felt to have relative sparing of the rectum from 0-15 cm.  Flagyl was added to his regimen.  He was also started on 6-MP at 75 mg per day.  He had had a previous profile done in the office  as well.  This did show a p-ANCA positive status and ASCA was negative. Initial  plans for TEE and cardioversion on Tuesday, May 30.  The patient went for the TEE and was found to have a probable left atrial thrombus and it was decided to postpone his cardioversion for approximately one month and to leave him on anticoagulation in the interim.  He also had been placed on Lopressor, Cardizem and Lanoxin by the cardiology service.  He finally began making progress.  By May 24 and May 25, we were able to advance his diet to a low residue diet in which he did tolerate.  He had not been  having any further significant abdominal pain and no further rectal bleeding.  His TNA was discontinued on June 5.  On June 6, it was felt that he was stable for discharge to home.  His Coumadin was therapeutic.  He was tolerating a low residue diet and feeling much better.  DISCHARGE MEDICATIONS:  1. Prednisone 60 mg p.o. q.d.  2. Asacol 400 mg four p.o. t.i.d.  3. Lanoxin 0.25 mg p.o. q.d.  4. Lopressor 50 mg b.i.d.  5. Flagyl 250 mg t.i.d.  6. Cardizem 240 mg q.d.  7. Niferex 150 mg b.i.d.  8. 6-MP 75 mg p.o. q.d.  9. Coumadin 3 mg per day. 10. Ativan 0.5 mg q.i.d. 11. Restoril 15 mg q.h.s. p.r.n. 12. Levsin q.6h. p.r.n.  FOLLOWUP:  Follow-up appointment scheduled with Dr. Scarlette Shorts on June 20, at 10:45 a.m.  Follow-up appointment with Dr. Dannielle Burn on June 25, at 9:45 a.m.  He was also scheduled to be followed in the Coumadin clinic with initial visit on June 8, at 9:15 a.m.  DIET:  Low residue diet.  CONDITION ON DISCHARGE:  Stable and improved. DD:  07/20/99 TD:  07/27/99 Job: [id number]

## 2010-07-01 NOTE — H&P (Signed)
[facility]  Patient:    Donald Duncan, Donald Duncan Visit Number: [id number] MRN: [id number]          Service Type: EMS Location: ED Attending Physician:  Tyrone Apple Dictated by:   Vena Rua, P.A. Admit Date:  12/26/2000                           History and Physical  CHIEF COMPLAINT:  Malaise, and temperature of 103 degrees following colonoscopy earlier today.  HISTORY OF PRESENT ILLNESS:  Donald Duncan is a 60 year old white male who has a history of universal colitis for which he was hospitalized in the summer of 2001.  The colitis is of an undetermined type, though Dr. Henrene Pastor has favored the diagnosis of ulcerative colitis.  He has fairly frequent breakthrough symptoms despite maximum Asacol, chronic prednisone, and 100 mg of 6-MP daily. Lately he has been having another bout with bloody stools associated with some minor lower abdominal pain.  He has anywhere from two stools daily up to four to six stools daily.  He has had anorexia and feels he has lost weight, though the exact amount is not known.  About a week ago Dr. Henrene Pastor upped his prednisone dose from 20 mg a day up to 30 mg a day, and he was set up for colonoscopy today.  This was performed, and showed severe colitis involving the left colon and rectum.  However, the right colon and ileum were normal. Biopsies were obtained of the involved area.  While he was recovering at the [facility], the patient did have some complaint of chills but no pain.  He went home and continued to have chills.  An ear thermometer temperature was obtained, and that was measuring 103 degrees.  Dr. Henrene Pastor was concerned because the patient is on chronic prednisone, and he is having such a high temperature, and possibly the prednisone could be masking any abdominal symptoms. Therefore, he told the patient to come to the emergency room at [facility].  Unfortunately, the hospital is very booked up, and there is no  bed available currently either in the ER or on the floor, but we did manage to examine the patient.  PAST MEDICAL HISTORY: 1. Indeterminate colitis, universal in the past, now involving the ______    colon and rectum. 2. History of atrial fibrillation while hospitalized with his colitis during    the summer of 2001.  ALLERGIES:  CODEINE, which causes itching.  SOCIAL HISTORY:  The patient is involved as a Catering manager, and his work is physically active.  He lives in [place] with his wife and children. The patient does not smoke or consume significant amounts of alcoholic beverages.  FAMILY HISTORY:  Please see prior record.  CURRENT MEDICATIONS: 1. Asacol 400 mg 4 pills t.i.d. for a total of 12 daily. 2. Prednisone, recently at 30 mg a day, but Dr. Henrene Pastor upped his dose today to    40 mg daily. 3. 6-MP 150 mg 2 p.o. q.d. 4. Ambien p.r.n.  REVIEW OF SYSTEMS:  GENITOURINARY:  No urinary symptoms such as hesitancy or frequency or burning.  GENERAL:  Anorexia with indeterminate amount of weight loss.  No sweating.  PULMONARY:  About a month ago he did have a head cold-type process with nasal congestion and some nonproductive cough, but this has long been resolved.  The patient denies any family members or co-workers who have had flu or illnesses involving  high temperatures.  PHYSICAL EXAMINATION:  VITAL SIGNS:  Blood pressure 100/56, pulse 127, respirations 18, temperature 97.6.  Room air saturations are 98%.  GENERAL:  Pale and exhausted-looking white male.  He is not toxic-appearing. He is an excellent historian and appears comfortable.  HEENT:  No scleral icterus.  No conjunctivae pallor.  The mucous membranes are somewhat dry.  There are no oral lesions.  The dentition is in good repair.  NECK:  No masses, no thyromegaly, and no JVD.  CHEST:  Breath sounds are excellent, and chest is clear to auscultation and percussion bilaterally.  HEART:  Regular rate and  rhythm, without tachycardia.  ABDOMEN:  Nondistended, soft, without active bowel sounds.  The exam does not elicit any tenderness.  There are no palpable masses or hepatosplenomegaly.  RECTAL:  Not performed.  GENITOURINARY:  Not performed.  LABORATORY DATA:  Currently ordered are CBC, CMET, lipase, urine C&S.  Imaging-wise, he has an acute abdominal series pending.  Medication-wise, IV normal saline has been ordered.  Again, the patient has not reached the ED proper and, therefore, these tests are all pending.  IMPRESSION: 1. Colitis of indeterminate type, though favoring ulcerative colitis.    Currently with active disease in the ______ colon and rectum despite    chronic steroids, 6-MP, and maximum mesalamine preparation. 2. Fever and chills following colonoscopy in patient on chronic steroids.    Rule out perforation, rule out fever secondary to unrelated viral    infection, i.e., the flu.  However, we need to be concerned about a fever    this high in somebody who is on chronic suppressive therapy. 3. History of atrial fibrillation.  Currently, the rate is regular but    rapid.  PLAN:  Observation admit.  Hopefully, he is going to get a bed either in the ED or on the floor before too many hours have passed.  Will also plan to start him on IV Unasyn, and continue all of his outpatient oral medications.  Diet of clears will be allowed once the KUB confirms that he has no perforation.Dictated by:   Vena Rua, P.A. Attending Physician:  Tyrone Apple DD:  12/26/00 TD:  12/26/00 Job: [id number]

## 2010-07-01 NOTE — H&P (Signed)
[facility]  Patient:    Donald Duncan, Donald Duncan                        MRN: [id number] Adm. Date:  [id number] Attending:  Vincent Duncan Dictator:   Donald Duncan, P.A.C. CC:         Donald Duncan. Donald Duncan, M.D. [facility]                         History and Physical  PROBLEM:  Refractory ulcerative colitis.  HISTORY OF PRESENT ILLNESS:  Donald Duncan is a pleasant, previously generally healthy, 60 year old white male with no known chronic medical problems.  He did sustain an injury to his thumb earlier this spring and required some surgery as well as antibiotics.  At this point, he says he has been sick over the past two months, initially with gradual onset of diarrhea, abdominal cramping, and intermittent bright red blood per rectum.  He had been evaluated by Dr. Henrene Duncan and had undergone colonoscopy apparently on April 30 and was found to have evidence of pancolitis.  He was initially treated with Asacol and Flagyl, which he did not tolerate well due to nausea and vomiting and this was stopped after about four days.  Over the past two weeks he has been on prednisone at 20 mg t.i.d. as well as high-dose Asacol at 1600 mg t.i.d.  He has also been using Levsin.  He was started on Questran earlier this week due to severe diarrhea but that has not been beneficial and he says it is making him feel worse with decreased appetite and decreased energy level, etc.  He has gradually lost about 18 pounds since onset of his symptoms and has been having intermittent fevers as well.  He said a couple of weeks ago he had a fever up to 103 and then yesterday again had a fever to 102 with chills.  He says his energy level is terrible.  He has been trying to eat but is "scarred to eat" because everything seems to go right through him.  He has had some abdominal discomfort and cramping which has not been severe.  He says he has not seen any blood in his stools over the past two weeks.  However,  he continues to have severe diarrhea with up to 14 bowel movements per day of loose stool.  CURRENT MEDICATIONS: 1. Prednisone 20 mg t.i.d. 2. Asacol 400 mg four p.o. t.i.d. 3. Levsin sublingual. 4. He had been taking Questran one packet q.i.d.  ALLERGIES:  CODEINE which causes itching.  PAST MEDICAL HISTORY:  Benign.  He did have an injury to his right thumb earlier this spring which required surgical repair and antibiotic.  FAMILY HISTORY:  Pertinent for father apparently diagnosed with colitis at a young age, now deceased.  Mother 57 with a history of Alzheimers.  Two sisters alive and well.  SOCIAL HISTORY:  The patient is married.  His wife is currently pregnant.  He is employed in Education officer, museum.  He has not been to work over the past month due to his illness.  He last traveled out of the country to [place] and [place] in September of 2000.  REVIEW OF SYSTEMS:  HEENT:  The patient does wear glasses.  CARDIOVASCULAR: No complaints of chest pain or anginal symptoms.  PULMONARY:  Negative for cough, shortness of breath or sputum production.  GENITOURINARY:  Denies any dysuria, urgency or frequency.  MUSCULOSKELETAL:  The patient has had some right shoulder discomfort and burning over the past couple of weeks. GASTROINTESTINAL:  As above.  PHYSICAL EXAMINATION:  GENERAL:  Per Dr. Olevia Duncan the patient is a well-developed, thin, ill-appearing white male.  The patient is alert and oriented x 3.  VITAL SIGNS:  Blood pressure 109/67, pulse is 111, temperature is 101.3, respirations 20.  HEENT:  Nontraumatic, normocephalic.  EOMI,  PERLA.  Sclerae anicteric.  No evidence of oral ulcerations.  NECK:  Supple without nodes.  No JVD or bruits.  CARDIOVASCULAR:  Tachycardia, regular rhythm with S1 and S2.  No murmur, rub or gallop.  LUNGS:  Clear to auscultation and percussion.  ABDOMEN:  Soft.  Some increased tympany.  Bowel sounds are hyperactive.  He is tender in the  left lower quadrant, greater than right lower quadrant.  No palpable mass or hepatosplenomegaly.  No CVA tenderness.  RECTAL:  Tight anal sphincter.  Very uncomfortable examination.  He does have external hemorrhoids and a nodular rectal ampulla mucosa.  Stool is heme-positive.  EXTREMITIES:  No clubbing, cyanosis, or edema.  He is quite thin.  LABORATORY DATA:  Pending at the time of admission.  IMPRESSION: 1. A 60 year old white male with new onset of acute ulcerative colitis and    failure to outpatient management.  Rule out bacteremia.  Rule out toxic    megacolon. 2. Malnutrition secondary to #1. 3. Dehydration, secondary to diarrhea.  PLAN:  The patient is admitted to the service of Dr. Delfin Duncan.  He will be placed at bowel rest, started on IV fluid hydration, IV Solu-Medrol, continued on high-dose Asacol.  We will discontinue the Questran.  Cover him with IV Cipro.  May need to consider TNA for bowel rest in the short term.  Will obtain blood cultures and repeat stool cultures as well.  For details, please see the orders. DD:  07/01/99 TD:  07/01/99 Job: [id number]

## 2010-07-01 NOTE — Procedures (Signed)
[facility]  Patient:    Donald Duncan, Donald Duncan                        MRN: [id number] Proc. Date: 07/14/99 Adm. Date:  [id number] Attending:  Vincent Peyer                           Procedure Report  PROCEDURE:  Transesophageal echocardiogram.  INDICATIONS:  This study is performed to rule out left atrial appendage thrombus prior to cardioversion.  DESCRIPTION OF PROCEDURE:  The patient was sedated with Demerol 50 mg and Versed 5 mg intravenously.  The pharynx was anesthetized with topical Hurricaine spray.  The Omniplane probe was passed without difficulty.  The left ventricular function was normal with an estimated ejection fraction of approximately 60%.  No focal wall motion abnormalities were appreciated. The right side was normal.  The left atrial appendage was visualized.  There was a small mobile density at the tip that may have represented left atrial appendage wall.  However, thrombus could not be completely excluded.  There was no atherosclerosis noted in the descending aorta.  The aortic valve was normal and there was no aortic insufficiency.  The mitral valve was mildly thickened and there was mild mitral regurgitation.  There was no tricuspid regurgitation.  There was no intraatrial flow communication by color.  FINAL INTERPRETATION: 1. Normal left ventricular function. 2. Left atrial appendage with a small mobile density (possibly due to    left atrial appendage wall but thrombus cannot be completely excluded). 3. Mild mitral regurgitation.  Given the above findings, I would favor Coumadin for a full four weeks with an INR of 2 to 3.  Electrical cardioversion can then be safely done as an outpatient. DD:  07/14/99 TD:  07/15/99 Job: [id number]

## 2010-10-21 ENCOUNTER — Encounter: Payer: Self-pay | Admitting: Internal Medicine

## 2010-12-05 ENCOUNTER — Telehealth: Payer: Self-pay | Admitting: Cardiology

## 2010-12-05 NOTE — Telephone Encounter (Signed)
Pt is to go to the ER per Dr Ron Parker.  Pt states he understands.

## 2010-12-05 NOTE — Telephone Encounter (Signed)
Donald Duncan is calling because he feels he is in atrial fib.  He states he was in atrial fib back in 2001 and feels the same way he did then.  He states he feels like he has a butterfly in his chest, sob and an irregular heartbeat, (unsure of rate).  It started with a cough and elevated temp on Thursday.  He states he rested all day Sat and felt a little better yesterday.  The sob is constant but the fluttering is off and on.

## 2010-12-05 NOTE — Telephone Encounter (Signed)
Patient calling C/O of irregular heart rate. Pt saw Dr. Dannielle Burn in 2001.

## 2010-12-16 ENCOUNTER — Encounter: Payer: Self-pay | Admitting: *Deleted

## 2010-12-19 ENCOUNTER — Encounter: Payer: Self-pay | Admitting: Internal Medicine

## 2010-12-19 ENCOUNTER — Ambulatory Visit (INDEPENDENT_AMBULATORY_CARE_PROVIDER_SITE_OTHER): Payer: PRIVATE HEALTH INSURANCE | Admitting: Internal Medicine

## 2010-12-19 VITALS — BP 138/80 | HR 68 | Ht 74.0 in | Wt 195.0 lb

## 2010-12-19 DIAGNOSIS — I4891 Unspecified atrial fibrillation: Secondary | ICD-10-CM

## 2010-12-19 DIAGNOSIS — K5010 Crohn's disease of large intestine without complications: Secondary | ICD-10-CM

## 2010-12-19 NOTE — Progress Notes (Signed)
HISTORY OF PRESENT ILLNESS:  Donald Duncan is a 60 y.o. 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 For this, he is maintained on Remicade 5 mg per kilogram IV every 8 weeks, has a solo agent. He receives this at an outpatient center in [facility]. Correspondence from that center finds that his infusion sessions are unremarkable. He has routine blood work which has been normal. His last TB testing, which was negative, was performed in August 2012 (per patient report). He was last seen in this office May 2012. He last underwent surveillance colonoscopy August 2010. He was found to have quiescent colitis with pseudopolyps, mostly in the left colon. Biopsies throughout the colon were taken and revealed benign colonic mucosa with out evidence of dysplasia. Since that visit he has done well. No evidence for colitis flare. No new medical problems. Nothing to suggest extracolonic manifestations of inflammatory bowel disease. His GI review of systems is negative. He was recently diagnosed with atrial fibrillation for which she underwent cardioversion and is currently on Pradaxa and metoprolol. For this problem, he is seeing a cardiologist in [place], but does not know the name of the treating physician. He has followup with his cardiologist within the next week or so.   REVIEW OF SYSTEMS:  All non-GI ROS negative except for heart rhythm change  Past Medical History  Diagnosis Date  . Crohn's colitis   . Diverticulosis   . Hemorrhoids   . Colon polyp, hyperplastic   . H/O: iron deficiency anemia   . Atrial fibrillation     Past Surgical History  Procedure Date  . Finger surgery     Thumb     Social History NEVADA MULLETT  reports that he has quit smoking. He has never used smokeless tobacco. He reports that he drinks alcohol. He reports that he does not use illicit drugs.  family history includes Alzheimer's disease in his mother; Colitis in his father;  Heart disease in his father and mother; and Lung cancer in his father.  There is no history of Colon cancer.  Allergies  Allergen Reactions  . Codeine Itching       PHYSICAL EXAMINATION: Vital signs: BP 138/80  Pulse 68  Ht 6' 2"  (1.88 m)  Wt 195 lb (88.451 kg)  BMI 25.04 kg/m2 General: Well-developed, well-nourished, no acute distress HEENT: Sclerae are anicteric, conjunctiva pink. Oral mucosa intact Lungs: Clear Heart: Regular rate and rhythm Abdomen: soft, nontender, nondistended, no obvious ascites, no peritoneal signs, normal bowel sounds. No organomegaly. Extremities: No edema Psychiatric: alert and oriented x3. Cooperative     ASSESSMENT:  #1. Indeterminate colitis, likely Crohn's. His disease remains clinically quiescent on Remicade therapy #2. Recent bout of atrial fibrillation status post successful cardioversion, now on Pradaxa and metoprolol   PLAN:  #1. Continue Remicade 5 mg per kilogram IV every 8 weeks #2. Continue TB testing annually. This is performed at the transfusion center #3. Currently due for surveillance colonoscopy with biopsies. However, given the recent issue with atrial fibrillation the current need for Pradaxa, we will hold off until he sees his cardiologist. If The patient is to come off of the anticoagulation therapy at some point in the near future, we can then set up his surveillance colonoscopy. If not, we can discuss with his cardiologist the possibility of interrupting therapy short term. The patient will contact this office regarding this issue, in the near future.

## 2010-12-19 NOTE — Patient Instructions (Addendum)
Please contact us to schedule colonoscopy once your cardiologist has decided whether it is safe you to come off of pradaxa for a procedure (per patient and Dr Henrene Pastor conversation)  cc:  [facility] Cardiologist

## 2010-12-30 DIAGNOSIS — I341 Nonrheumatic mitral (valve) prolapse: Secondary | ICD-10-CM | POA: Insufficient documentation

## 2010-12-30 DIAGNOSIS — I340 Nonrheumatic mitral (valve) insufficiency: Secondary | ICD-10-CM | POA: Insufficient documentation

## 2010-12-30 DIAGNOSIS — I4819 Other persistent atrial fibrillation: Secondary | ICD-10-CM | POA: Insufficient documentation

## 2011-03-28 ENCOUNTER — Telehealth: Payer: Self-pay | Admitting: Internal Medicine

## 2011-03-28 NOTE — Telephone Encounter (Signed)
Pt was sent recall letter in September for colon. Pt had some trouble with atrial fib at the time and was on Pradaxa. Pt states he is off of the Pradaxa now and only on aspirin. Is it ok to schedule the pt for a direct colon? Pt was just seen for an OV 12/19/10. Dr. Henrene Pastor please advise.

## 2011-03-28 NOTE — Telephone Encounter (Signed)
YES, DIRECT COLON IS FINE.

## 2011-03-28 NOTE — Telephone Encounter (Signed)
Pt scheduled for previsit 05/01/11@8am , scheduled for colon 05/11/11@10 :30am. Pt aware of appt dates and times.

## 2011-05-01 ENCOUNTER — Ambulatory Visit (AMBULATORY_SURGERY_CENTER): Payer: PRIVATE HEALTH INSURANCE | Admitting: *Deleted

## 2011-05-01 VITALS — Ht 74.0 in | Wt 195.0 lb

## 2011-05-01 DIAGNOSIS — K5190 Ulcerative colitis, unspecified, without complications: Secondary | ICD-10-CM

## 2011-05-01 MED ORDER — PEG-KCL-NACL-NASULF-NA ASC-C 100 G PO SOLR: ORAL | Status: DC

## 2011-05-11 ENCOUNTER — Ambulatory Visit (AMBULATORY_SURGERY_CENTER): Payer: PRIVATE HEALTH INSURANCE | Admitting: Internal Medicine

## 2011-05-11 ENCOUNTER — Encounter: Payer: Self-pay | Admitting: Internal Medicine

## 2011-05-11 VITALS — BP 127/69 | HR 75 | Temp 97.5°F | Resp 12 | Ht 74.0 in | Wt 195.0 lb

## 2011-05-11 DIAGNOSIS — Z1211 Encounter for screening for malignant neoplasm of colon: Secondary | ICD-10-CM

## 2011-05-11 DIAGNOSIS — D126 Benign neoplasm of colon, unspecified: Secondary | ICD-10-CM

## 2011-05-11 DIAGNOSIS — K5190 Ulcerative colitis, unspecified, without complications: Secondary | ICD-10-CM

## 2011-05-11 MED ORDER — SODIUM CHLORIDE 0.9 % IV SOLN: 500.0000 mL | INTRAVENOUS | Status: DC

## 2011-05-11 NOTE — Progress Notes (Signed)
Patient did not experience any of the following events: a burn prior to discharge; a fall within the facility; wrong site/side/patient/procedure/implant event; or a hospital transfer or hospital admission upon discharge from the facility. (G8907) Patient did not have preoperative order for IV antibiotic SSI prophylaxis. (G8918)  

## 2011-05-11 NOTE — Patient Instructions (Signed)
YOU HAD AN ENDOSCOPIC PROCEDURE TODAY AT [facility]: Refer to the procedure report that was given to you for any specific questions about what was found during the examination.  If the procedure report does not answer your questions, please call your gastroenterologist to clarify.  If you requested that your care partner not be given the details of your procedure findings, then the procedure report has been included in a sealed envelope for you to review at your convenience later.  YOU SHOULD EXPECT: Some feelings of bloating in the abdomen. Passage of more gas than usual.  Walking can help get rid of the air that was put into your GI tract during the procedure and reduce the bloating. If you had a lower endoscopy (such as a colonoscopy or flexible sigmoidoscopy) you may notice spotting of blood in your stool or on the toilet paper. If you underwent a bowel prep for your procedure, then you may not have a normal bowel movement for a few days.  DIET: Your first meal following the procedure should be a light meal and then it is ok to progress to your normal diet.  A half-sandwich or bowl of soup is an example of a good first meal.  Heavy or fried foods are harder to digest and may make you feel nauseous or bloated.  Likewise meals heavy in dairy and vegetables can cause extra gas to form and this can also increase the bloating.  Drink plenty of fluids but you should avoid alcoholic beverages for 24 hours.  ACTIVITY: Your care partner should take you home directly after the procedure.  You should plan to take it easy, moving slowly for the rest of the day.  You can resume normal activity the day after the procedure however you should NOT DRIVE or use heavy machinery for 24 hours (because of the sedation medicines used during the test).    SYMPTOMS TO REPORT IMMEDIATELY: A gastroenterologist can be reached at any hour.  During normal business hours, 8:30 AM to 5:00 PM Monday through Friday,  call [phone].  After hours and on weekends, please call the GI answering service at [phone] who will take a message and have the physician on call contact you.   Following lower endoscopy (colonoscopy or flexible sigmoidoscopy):  Excessive amounts of blood in the stool  Significant tenderness or worsening of abdominal pains  Swelling of the abdomen that is new, acute  Fever of 100F or higher  FOLLOW UP: If any biopsies were taken you will be contacted by phone or by letter within the next 1-3 weeks.  Call your gastroenterologist if you have not heard about the biopsies in 3 weeks.  Our staff will call the home number listed on your records the next business day following your procedure to check on you and address any questions or concerns that you may have at that time regarding the information given to you following your procedure. This is a courtesy call and so if there is no answer at the home number and we have not heard from you through the emergency physician on call, we will assume that you have returned to your regular daily activities without incident.  SIGNATURES/CONFIDENTIALITY: You and/or your care partner have signed paperwork which will be entered into your electronic medical record.  These signatures attest to the fact that that the information above on your After Visit Summary has been reviewed and is understood.  Full responsibility of the confidentiality of this  discharge information lies with you and/or your care-partner.   Resume medications. Discharge Information reviewed with family.

## 2011-05-11 NOTE — Op Note (Signed)
[facility] [address] [place], [place]  [address]  COLONOSCOPY PROCEDURE REPORT  PATIENT:  Donald Duncan, Donald Duncan  MR#:  [id number] BIRTHDATE:  10/29/1950, 60 yrs. old  GENDER:  male ENDOSCOPIST:  Docia Chuck. Geri Seminole, MD REF. BY:  Surveillance Program Recall, PROCEDURE DATE:  05/11/2011 PROCEDURE:  Colonoscopy with biopsies ASA CLASS:  Class II INDICATIONS:  surveillance and high-risk screening, evaluation of ulcerative colitis ; longstanding indeterminate colitis (probably UC); last exam 09-2008 MEDICATIONS:   MAC sedation, administered by CRNA, propofol (Diprivan) 350 mg IV  DESCRIPTION OF PROCEDURE:   After the risks benefits and alternatives of the procedure were thoroughly explained, informed consent was obtained.  Digital rectal exam was performed and revealed no abnormalities.   The LB CF-[id number] endoscope was introduced through the anus and advanced to the cecum, which was identified by both the appendix and ileocecal valve, without limitations.  The quality of the prep was excellent, using MoviPrep.  The instrument was then slowly withdrawn as the colon was fully examined. <<PROCEDUREIMAGES>>  FINDINGS:  Multiple pseudopolyps and scarring predominantly in the left colon, though some in proximal colon as well.  Rare Diverticula were found in the sigmoid colon.  Otherwise normal colonoscopy without  masses, vascular ectasias, or inflammatory changes.   Retroflexed views in the rectum revealed no abnormalities. 4Q bx q 10cm (N=32) taken.   The time to cecum = 2:40  minutes. The scope was then withdrawn in 18:50  minutes from the cecum and the procedure completed.  COMPLICATIONS:  None  ENDOSCOPIC IMPRESSION: 1) Pseudopolyps in the colon 2) Rare Diverticula in the sigmoid colon 3) Otherwise normal colonoscopy . No active colitis  RECOMMENDATIONS: 1) Repeat Colonoscopy in 2-3 years, if no dysplasia on bx.  ______________________________ Docia Chuck. Geri Seminole,  MD  CC:  The Patient  n. eSIGNED:   Docia Chuck. Geri Seminole at 05/11/2011 11:51 AM  Naida Sleight, [phone]

## 2011-05-15 ENCOUNTER — Telehealth: Payer: Self-pay | Admitting: *Deleted

## 2011-05-15 NOTE — Telephone Encounter (Signed)
  Follow up Call-  Call back number 05/11/2011  Post procedure Call Back phone  # [phone] cell  Permission to leave phone message Yes     Patient questions:  Do you have a fever, pain , or abdominal swelling? no Pain Score  0 *  Have you tolerated food without any problems? yes  Have you been able to return to your normal activities? yes  Do you have any questions about your discharge instructions: Diet   no Medications  no Follow up visit  no  Do you have questions or concerns about your Care? no  Actions: * If pain score is 4 or above: No action needed, pain <4.

## 2011-05-22 ENCOUNTER — Encounter: Payer: Self-pay | Admitting: Internal Medicine

## 2011-12-13 ENCOUNTER — Ambulatory Visit (INDEPENDENT_AMBULATORY_CARE_PROVIDER_SITE_OTHER): Payer: PRIVATE HEALTH INSURANCE | Admitting: Internal Medicine

## 2011-12-13 ENCOUNTER — Encounter: Payer: Self-pay | Admitting: Internal Medicine

## 2011-12-13 VITALS — BP 108/60 | HR 76 | Ht 74.0 in | Wt 195.8 lb

## 2011-12-13 DIAGNOSIS — K5289 Other specified noninfective gastroenteritis and colitis: Secondary | ICD-10-CM

## 2011-12-13 DIAGNOSIS — K523 Indeterminate colitis: Secondary | ICD-10-CM

## 2011-12-13 DIAGNOSIS — K5190 Ulcerative colitis, unspecified, without complications: Secondary | ICD-10-CM

## 2011-12-13 NOTE — Patient Instructions (Addendum)
Please follow up with Dr. Henrene Pastor in one year.  Our fax number is [phone] for [facility] to fax your lab work

## 2011-12-13 NOTE — Progress Notes (Signed)
HISTORY OF PRESENT ILLNESS:  Donald Duncan is a 61 y.o. 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 For this, he is maintained on Remicade 5 mg per kilogram IV every 8 weeks, as a solo therapy. He continues to receive this at outpatient center in [facility]. He reports his last transfusion within the past month. He is due again in December. He tells me that he had both blood work and TB testing this year. I do not have those results. He last underwent surveillance colonoscopy with biopsies 05/11/2011. The colonic mucosa was benign without active inflammation or dysplasia. Routine followup in 2-3 years recommended. He has had no clinical problems since that time. He did have questions regarding long-term use of Remicade as well as concerns over its cost and the possible impact of healthcare overhaul in this country.  REVIEW OF SYSTEMS:  All non-GI ROS negative except for joint aches, back pain, cardia arrhythmia, itching, rash  Past Medical History  Diagnosis Date  . Crohn's colitis   . Diverticulosis   . Hemorrhoids   . Colon polyp, hyperplastic   . H/O: iron deficiency anemia   . Atrial fibrillation     Past Surgical History  Procedure Date  . Finger surgery     Thumb ; right    Social History Donald Duncan  reports that he has quit smoking. His smoking use included Cigarettes. He has never used smokeless tobacco. He reports that he drinks alcohol. He reports that he does not use illicit drugs.  family history includes Alzheimer's disease in his mother; Heart disease in his father and mother; and Lung cancer in his father.  There is no history of Colon cancer, and Esophageal cancer, and Rectal cancer, and Stomach cancer, .  Allergies  Allergen Reactions  . Codeine Itching  . Hydrocodone        PHYSICAL EXAMINATION: Vital signs: BP 108/60  Pulse 76  Ht 6' 2"  (1.88 m)  Wt 195 lb 12.8 oz (88.814 kg)  BMI 25.14 kg/m2 General:  Well-developed, well-nourished, no acute distress HEENT: Sclerae are anicteric, conjunctiva pink. Oral mucosa intact Lungs: Clear Heart: Regular Abdomen: soft, nontender, nondistended, no obvious ascites, no peritoneal signs, normal bowel sounds. No organomegaly. Extremities: No edema Psychiatric: alert and oriented x3. Cooperative    ASSESSMENT:  #1. Chronic long-standing indeterminate colitis. He remains asymptomatic on Crohn's disease. Mucosal healing documented earlier this year. I reviewed with him today, again, the current literature regarding Remicade use, benefits, and potential side effects or risks.   PLAN:  #1. Continue Remicade at current dosage #2. Continue annual TB testing and periodic laboratories. He will have results fax to my office. #3. Surveillance colonoscopy in 2 years. #4. Routine office followup in 1 year. Sooner if needed.

## 2012-10-30 ENCOUNTER — Telehealth: Payer: Self-pay | Admitting: Internal Medicine

## 2012-10-30 NOTE — Telephone Encounter (Signed)
Pt is scheduled for Remicade infusion tomorrow. [facility] faxed over request for new orders. New orders faxed back and confirmed with Lattie Haw at [facility]. Pt had TB skin test Monday 10/28/12. Lattie Haw to fax results over from TB test.

## 2013-02-21 ENCOUNTER — Telehealth: Payer: Self-pay | Admitting: Internal Medicine

## 2013-02-21 ENCOUNTER — Other Ambulatory Visit: Payer: Self-pay

## 2013-02-21 DIAGNOSIS — K5090 Crohn's disease, unspecified, without complications: Secondary | ICD-10-CM

## 2013-02-21 NOTE — Telephone Encounter (Signed)
Left message for Kieth Brightly to call back.  Pt needs prior auth for remicade at [facility] in [place]. Referral entered for Remicade prior-auth

## 2013-02-25 NOTE — Telephone Encounter (Signed)
Pts reference number [id number], Exp:  02/25/14. Penny aware.

## 2013-03-07 ENCOUNTER — Encounter: Payer: Self-pay | Admitting: Internal Medicine

## 2013-03-07 ENCOUNTER — Ambulatory Visit (INDEPENDENT_AMBULATORY_CARE_PROVIDER_SITE_OTHER): Payer: BC Managed Care – PPO | Admitting: Internal Medicine

## 2013-03-07 VITALS — BP 102/62 | HR 84 | Ht 73.23 in | Wt 203.1 lb

## 2013-03-07 DIAGNOSIS — K5190 Ulcerative colitis, unspecified, without complications: Secondary | ICD-10-CM

## 2013-03-07 DIAGNOSIS — K5289 Other specified noninfective gastroenteritis and colitis: Secondary | ICD-10-CM

## 2013-03-07 DIAGNOSIS — K523 Indeterminate colitis: Secondary | ICD-10-CM

## 2013-03-07 DIAGNOSIS — K5090 Crohn's disease, unspecified, without complications: Secondary | ICD-10-CM

## 2013-03-07 NOTE — Patient Instructions (Signed)
Please follow up with Dr. Perry as needed 

## 2013-03-07 NOTE — Progress Notes (Signed)
HISTORY OF PRESENT ILLNESS:  Donald Duncan is a 63 y.o. 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 For this, he is maintained on Remicade 5 mg per kilogram IV every 8 weeks. He continues to receive this as an outpatient with [facility]. He was last seen in this office 12/13/2011. See that dictation. He continues to have routine general laboratories and annual TB testing at [facility]. His last surveillance colonoscopy was performed 05/11/2011. The mucosa was benign without active inflammation or dysplasia. Followup in 2-3 years recommended. He continues to be asymptomatic. Normal bowel movements without pain or bleeding. No complaints. No medication side effects.  REVIEW OF SYSTEMS:  All non-GI ROS negative   Past Medical History  Diagnosis Date  . Crohn's colitis   . Diverticulosis   . Hemorrhoids   . Colon polyp, hyperplastic   . H/O: iron deficiency anemia   . Atrial fibrillation     Past Surgical History  Procedure Laterality Date  . Finger surgery      Thumb ; right    Social History RAI SEVERNS  reports that he has quit smoking. His smoking use included Cigarettes. He smoked 0.00 packs per day. He has never used smokeless tobacco. He reports that he drinks alcohol. He reports that he does not use illicit drugs.  family history includes Alzheimer's disease in his mother; Heart disease in his father and mother; Lung cancer in his father. There is no history of Colon cancer, Esophageal cancer, Rectal cancer, or Stomach cancer.  Allergies  Allergen Reactions  . Codeine Itching       PHYSICAL EXAMINATION: Vital signs: BP 102/62  Pulse 84  Ht 6' 1.23" (1.86 m)  Wt 203 lb 2 oz (92.137 kg)  BMI 26.63 kg/m2  Constitutional: generally well-appearing, no acute distress Psychiatric: alert and oriented x3, cooperative Eyes: extraocular movements intact, anicteric, conjunctiva pink Mouth: oral pharynx moist, no  lesions Neck: supple no lymphadenopathy Cardiovascular: heart regular rate and rhythm, no murmur Lungs: clear to auscultation bilaterally Abdomen: soft, nontender, nondistended, no obvious ascites, no peritoneal signs, normal bowel sounds, no organomegaly Rectal: Omitted Extremities: no lower extremity edema bilaterally Skin: no lesions on visible extremities. Onychomycoses of the nails Neuro: No focal deficits. No asterixis.    ASSESSMENT:  #1. Chronic long-standing indeterminate colitis. He remains asymptomatic on Remicade therapy. Last colonoscopy March 2013   PLAN:  #1. Continue on Remicade therapy to maintain remission. Monitoring of blood work and TB testing per [facility] outpatient infusion center #2. Office followup in 1 year #3. Repeat surveillance colonoscopy around March 2016

## 2014-04-20 ENCOUNTER — Encounter: Payer: Self-pay | Admitting: Internal Medicine

## 2014-05-04 ENCOUNTER — Encounter: Payer: Self-pay | Admitting: Internal Medicine

## 2014-05-22 ENCOUNTER — Telehealth: Payer: Self-pay | Admitting: Internal Medicine

## 2014-05-25 NOTE — Telephone Encounter (Signed)
Message routed to [facility]

## 2014-05-25 NOTE — Telephone Encounter (Signed)
Orders with new ICD-10 codes faxed.

## 2014-06-18 ENCOUNTER — Ambulatory Visit (AMBULATORY_SURGERY_CENTER): Payer: Self-pay

## 2014-06-18 ENCOUNTER — Encounter: Payer: Self-pay | Admitting: Internal Medicine

## 2014-06-18 VITALS — Ht 74.0 in | Wt 202.2 lb

## 2014-06-18 DIAGNOSIS — K51919 Ulcerative colitis, unspecified with unspecified complications: Secondary | ICD-10-CM

## 2014-06-18 MED ORDER — MOVIPREP 100 G PO SOLR: 1.0000 | Freq: Once | ORAL | Status: DC

## 2014-06-18 NOTE — Progress Notes (Signed)
No allergies to eggs or soy No home oxygen No past problems with anesthesia No diet/weigth loss meds

## 2014-07-02 ENCOUNTER — Encounter: Payer: Self-pay | Admitting: Internal Medicine

## 2014-07-02 ENCOUNTER — Other Ambulatory Visit: Payer: Self-pay | Admitting: Internal Medicine

## 2014-07-02 ENCOUNTER — Ambulatory Visit (AMBULATORY_SURGERY_CENTER): Payer: BLUE CROSS/BLUE SHIELD | Admitting: Internal Medicine

## 2014-07-02 VITALS — BP 141/57 | HR 69 | Temp 96.9°F | Resp 11 | Ht 74.0 in | Wt 202.0 lb

## 2014-07-02 DIAGNOSIS — D122 Benign neoplasm of ascending colon: Secondary | ICD-10-CM

## 2014-07-02 DIAGNOSIS — Z1211 Encounter for screening for malignant neoplasm of colon: Secondary | ICD-10-CM

## 2014-07-02 DIAGNOSIS — K635 Polyp of colon: Secondary | ICD-10-CM

## 2014-07-02 DIAGNOSIS — K51919 Ulcerative colitis, unspecified with unspecified complications: Secondary | ICD-10-CM

## 2014-07-02 DIAGNOSIS — K51019 Ulcerative (chronic) pancolitis with unspecified complications: Secondary | ICD-10-CM

## 2014-07-02 MED ORDER — SODIUM CHLORIDE 0.9 % IV SOLN: 500.0000 mL | INTRAVENOUS | Status: DC

## 2014-07-02 NOTE — Progress Notes (Signed)
Pt awake and alert, pleased with MAC. Report to RN

## 2014-07-02 NOTE — Progress Notes (Signed)
Called to room to assist during endoscopic procedure.  Patient ID and intended procedure confirmed with present staff. Received instructions for my participation in the procedure from the performing physician.  

## 2014-07-02 NOTE — Patient Instructions (Signed)
YOU HAD AN ENDOSCOPIC PROCEDURE TODAY AT [facility]:   Refer to the procedure report that was given to you for any specific questions about what was found during the examination.  If the procedure report does not answer your questions, please call your gastroenterologist to clarify.  If you requested that your care partner not be given the details of your procedure findings, then the procedure report has been included in a sealed envelope for you to review at your convenience later.  YOU SHOULD EXPECT: Some feelings of bloating in the abdomen. Passage of more gas than usual.  Walking can help get rid of the air that was put into your GI tract during the procedure and reduce the bloating. If you had a lower endoscopy (such as a colonoscopy or flexible sigmoidoscopy) you may notice spotting of blood in your stool or on the toilet paper. If you underwent a bowel prep for your procedure, you may not have a normal bowel movement for a few days.  Please Note:  You might notice some irritation and congestion in your nose or some drainage.  This is from the oxygen used during your procedure.  There is no need for concern and it should clear up in a day or so.  SYMPTOMS TO REPORT IMMEDIATELY:   Following lower endoscopy (colonoscopy or flexible sigmoidoscopy):  Excessive amounts of blood in the stool  Significant tenderness or worsening of abdominal pains  Swelling of the abdomen that is new, acute  Fever of 100F or higher   For urgent or emergent issues, a gastroenterologist can be reached at any hour by calling [phone].   DIET: Your first meal following the procedure should be a small meal and then it is ok to progress to your normal diet. Heavy or fried foods are harder to digest and may make you feel nauseous or bloated.  Likewise, meals heavy in dairy and vegetables can increase bloating.  Drink plenty of fluids but you should avoid alcoholic beverages for 24 hours. Try to  increase the fiber in your diet if it's possible with your colitis.  ACTIVITY:  You should plan to take it easy for the rest of today and you should NOT DRIVE or use heavy machinery until tomorrow (because of the sedation medicines used during the test).    FOLLOW UP: Our staff will call the number listed on your records the next business day following your procedure to check on you and address any questions or concerns that you may have regarding the information given to you following your procedure. If we do not reach you, we will leave a message.  However, if you are feeling well and you are not experiencing any problems, there is no need to return our call.  We will assume that you have returned to your regular daily activities without incident.  If any biopsies were taken you will be contacted by phone or by letter within the next 1-3 weeks.  Please call us at [phone] if you have not heard about the biopsies in 3 weeks.    SIGNATURES/CONFIDENTIALITY: You and/or your care partner have signed paperwork which will be entered into your electronic medical record.  These signatures attest to the fact that that the information above on your After Visit Summary has been reviewed and is understood.  Full responsibility of the confidentiality of this discharge information lies with you and/or your care-partner.  Try to read all of the handouts given to you  by your recovery room nurse.

## 2014-07-02 NOTE — Op Note (Signed)
[facility]  [address]. [place], [address]   COLONOSCOPY PROCEDURE REPORT  PATIENT: Donald Duncan, Donald Duncan  MR#: [id number] BIRTHDATE: 16-Jun-1950 , 63  yrs. old GENDER: male ENDOSCOPIST: Eustace Quail, MD REFERRED BW:[id number] Program Recall PROCEDURE DATE:  07/02/2014 PROCEDURE:   Colonoscopy, surveillance , Colonoscopy with biopsy, and Colonoscopy with snare polypectomy x 1 First Screening Colonoscopy - Avg.  risk and is 50 yrs.  old or older - No.  Prior Negative Screening - Now for repeat screening. N/A  History of Adenoma - Now for follow-up colonoscopy & has been > or = to 3 yrs.  N/A  Polyps removed today? Yes ASA CLASS:   Class II INDICATIONS:Screening for colonic neoplasia and Inflammatory Bowel Disease (at least 8 years pancolitis or 15 years left sided colitis). . History of indeterminate colitis. In remission on Remicade. Last surveillance colonoscopy March 2013. No dysplasia MEDICATIONS: Monitored anesthesia care and Propofol 400 mg IV DESCRIPTION OF PROCEDURE:   After the risks benefits and alternatives of the procedure were thoroughly explained, informed consent was obtained.  The digital rectal exam revealed no abnormalities of the rectum.   The LB PFC-H190 [id number]  endoscope was introduced through the anus and advanced to the cecum, which was identified by both the appendix and ileocecal valve. No adverse events experienced.   The quality of the prep was excellent. (MoviPrep was used)  The instrument was then slowly withdrawn as the colon was fully examined. Estimated blood loss is zero unless otherwise noted in this procedure report.  COLON FINDINGS: A single polyp measuring 2 mm in size was found in the ascending colon.  A polypectomy was performed with a cold snare.  The resection was complete, the polyp tissue was completely retrieved and sent to histology.   There was moderate diverticulosis noted in the left colon. Also, throughout the  left colon and rectum were multiple pseudopolyps. There was no obvious active inflammation. 4 quadrant biopsies were taken approximately every 10 cm. 32-36 biopsies taken.   The examination was otherwise normal.  Retroflexed views revealed no abnormalities. The time to cecum = 2.0 Withdrawal time = 20.5   The scope was withdrawn and the procedure completed. COMPLICATIONS: There were no immediate complications. ENDOSCOPIC IMPRESSION: 1.   Single polyp was found in the ascending colon; polypectomy was performed with a cold snare 2.   Moderate diverticulosis was noted in the left colon 3.   The examination was otherwise normal  RECOMMENDATIONS: 1.  Await pathology results 2.  Continue current medications 3.  Repeat Colonoscopy in 3 years, if no dysplasia. 4. Routine office follow-up in 1 year. Sooner if needed  eSigned:  Eustace Quail, MD 07/02/2014 9:27 AM   cc: The Patient

## 2014-07-03 ENCOUNTER — Telehealth: Payer: Self-pay | Admitting: *Deleted

## 2014-07-03 NOTE — Telephone Encounter (Signed)
Left message that we called for f/u

## 2014-07-08 ENCOUNTER — Encounter: Payer: Self-pay | Admitting: Internal Medicine

## 2014-09-01 ENCOUNTER — Other Ambulatory Visit: Payer: Self-pay

## 2014-09-01 DIAGNOSIS — K5090 Crohn's disease, unspecified, without complications: Secondary | ICD-10-CM

## 2015-01-21 ENCOUNTER — Ambulatory Visit (INDEPENDENT_AMBULATORY_CARE_PROVIDER_SITE_OTHER): Payer: BLUE CROSS/BLUE SHIELD | Admitting: Internal Medicine

## 2015-01-21 ENCOUNTER — Encounter: Payer: Self-pay | Admitting: Internal Medicine

## 2015-01-21 VITALS — BP 152/86 | HR 72 | Ht 74.0 in | Wt 210.0 lb

## 2015-01-21 DIAGNOSIS — B351 Tinea unguium: Secondary | ICD-10-CM | POA: Diagnosis not present

## 2015-01-21 DIAGNOSIS — K50918 Crohn's disease, unspecified, with other complication: Secondary | ICD-10-CM | POA: Diagnosis not present

## 2015-01-21 NOTE — Patient Instructions (Signed)
Please follow up in one year

## 2015-01-21 NOTE — Progress Notes (Signed)
HISTORY OF PRESENT ILLNESS:  Donald Duncan is a 64 y.o. 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 For this, he is maintained on Remicade 5 mg/kg IV every 8 weeks. He continues to receive Remicade through the [facility]. Regular blood work and TB testing performed at that facility. The patient was last evaluated in the office January 2015. At that time he was asymptomatic on Remicade therapy. In May 2016 he underwent surveillance colonoscopy. Random colon biopsies revealed unremarkable colonic mucosa without dysplasia. Diminutive colon polyp was found to be lymphoid nature. Routine office follow-up in 1 year and repeat colonoscopy in 3 years recommended. Patient is pleased report that he has no GI symptoms. He denies abdominal pain, abnormal bowel habits, or bleeding. No appreciable medication side effects. He does notice that in the week or 2 preceding his scheduled infusion therapy he will experience arthralgias. His only other complaint is fungus on his fingernails and toenails. Outside blood work and TB testing from this year reviewed. Unremarkable.  REVIEW OF SYSTEMS:  All non-GI ROS negative except for onychomycoses  Past Medical History  Diagnosis Date  . Crohn's colitis ([place])   . Diverticulosis   . Hemorrhoids   . Colon polyp, hyperplastic   . H/O: iron deficiency anemia   . Atrial fibrillation [facility])     Past Surgical History  Procedure Laterality Date  . Finger surgery      Thumb ; right    Social History MONTY SPICHER  reports that he has quit smoking. His smoking use included Cigarettes. He has never used smokeless tobacco. He reports that he drinks alcohol. He reports that he does not use illicit drugs.  family history includes Alzheimer's disease in his mother; Heart disease in his father and mother; Lung cancer in his father. There is no history of Colon cancer, Esophageal cancer, Rectal cancer, or Stomach  cancer.  Allergies  Allergen Reactions  . Codeine Itching       PHYSICAL EXAMINATION: Vital signs: BP 152/86 mmHg  Pulse 72  Ht 6' 2"  (1.88 m)  Wt 210 lb (95.255 kg)  BMI 26.95 kg/m2  Constitutional: generally well-appearing, no acute distress Psychiatric: alert and oriented x3, cooperative Eyes: extraocular movements intact, anicteric, conjunctiva pink Mouth: oral pharynx moist, no lesions. No aphthous ulcers Neck: supple without thyromegaly, ommitted no lymphadenopathy Cardiovascular: heart regular rate and rhythm, no murmur Lungs: clear to auscultation bilaterally Abdomen: soft, nontender, nondistended, no obvious ascites, no peritoneal signs, normal bowel sounds, no organomegaly Rectal: Extremities: no clubbing cyanosis or lower extremity edema bilaterally Skin: Onychomycoses left hand. Otherwise cranial nerves intact. no lesions on visible extremities Neuro: No focal deficits.  ASSESSMENT:  #1. Chronic long-standing indeterminate colitis. He remains asymptomatic on Remicade therapy. Last colonoscopy May 2016 as described #2. Onychomycoses   PLAN:  #1. Continue Remicade infusion therapy #2. Continue routine blood work and TB testing through the infusion center #3. Consult with dermatologist regarding possible oral antifungal agents for onychomycoses #4. Routine GI office follow-up one year. Sooner if needed #5. Surveillance colonoscopy around 2019

## 2015-04-14 ENCOUNTER — Telehealth: Payer: Self-pay | Admitting: Internal Medicine

## 2015-04-14 NOTE — Telephone Encounter (Signed)
Pt states 2 weeks ago he had atrial fib and had to be hospitalized and cardioverted. States that he has been placed on blood thinners. He reports since then he has been having quite a bit of joint pain/hip pain. Pt took ibuprofen even though he knows he isn't supposed to and it helped. Pt wants to know what Dr. Henrene Pastor thinks is ok for him to take. States tylenol doesn't really help. Please advise.

## 2015-04-14 NOTE — Telephone Encounter (Signed)
Spoke with pt and he is aware.

## 2015-04-14 NOTE — Telephone Encounter (Signed)
Needs to be very careful with NSAIDs. Acutely on blood thinners. Maybe he can check with his PCP for other alternatives if Tylenol does not work

## 2015-04-23 ENCOUNTER — Encounter: Payer: Self-pay | Admitting: Internal Medicine

## 2015-05-11 ENCOUNTER — Encounter: Payer: Self-pay | Admitting: Internal Medicine

## 2015-07-06 ENCOUNTER — Telehealth: Payer: Self-pay

## 2015-07-06 NOTE — Telephone Encounter (Signed)
Received call from [facility], pt is there for Remicade. Pt wt 203lbs today, to receive Remicade 55m/kg-460mg. Asking if ok for pt to receive 5034mdue to remicade coming in 10032mials. [facility] they would round up to 500m28mso. [facility]ade 500mg55m

## 2015-08-02 ENCOUNTER — Other Ambulatory Visit: Payer: Self-pay

## 2015-08-02 DIAGNOSIS — K50919 Crohn's disease, unspecified, with unspecified complications: Secondary | ICD-10-CM

## 2015-09-30 ENCOUNTER — Telehealth: Payer: Self-pay | Admitting: Internal Medicine

## 2015-09-30 NOTE — Telephone Encounter (Signed)
Updated orders faxed to [facility] for Remicade 64m/kg IV every 8 weeks.

## 2016-01-14 ENCOUNTER — Telehealth: Payer: Self-pay | Admitting: Internal Medicine

## 2016-01-14 NOTE — Telephone Encounter (Signed)
Left message for pt to call back, needs OV-last seen in December of 2016. Dr. Henrene Pastor can decide on what labs he wants at the time of the visit.

## 2016-01-14 NOTE — Telephone Encounter (Signed)
Pt called and scheduled appt with front office.

## 2016-01-17 ENCOUNTER — Ambulatory Visit (INDEPENDENT_AMBULATORY_CARE_PROVIDER_SITE_OTHER): Payer: BLUE CROSS/BLUE SHIELD | Admitting: Internal Medicine

## 2016-01-17 ENCOUNTER — Encounter: Payer: Self-pay | Admitting: Internal Medicine

## 2016-01-17 VITALS — BP 110/64 | HR 76 | Ht 74.0 in | Wt 204.8 lb

## 2016-01-17 DIAGNOSIS — K51019 Ulcerative (chronic) pancolitis with unspecified complications: Secondary | ICD-10-CM

## 2016-01-17 DIAGNOSIS — K5010 Crohn's disease of large intestine without complications: Secondary | ICD-10-CM

## 2016-01-17 NOTE — Patient Instructions (Signed)
Please follow up in one year

## 2016-01-17 NOTE — Progress Notes (Signed)
HISTORY OF PRESENT ILLNESS:  Donald Duncan is a 65 y.o. 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 For this he has been maintained on Remicade 5 mg/kg IV every 8 weeks through outpatient transfusion center elsewhere. He was last evaluated December 2016. See that dictation for details. Outside blood work and TB testing from that year was unremarkable. He continues on therapy without interruption. He is now on Eliquis for paroxysmal atrial fibrillation. He did have his toenail fungus and finger nail fungus treated with partial success. He presents today for routine follow-up. Patient inquires about need for blood work. He cannot recall whether he has had blood work or TB testing this year. In general, the transfusion center has been diligent in this regard. The patient is pleased report that he is asymptomatic. Formed bowel movements without bleeding, pain, or other issues. His last colonoscopy was May 2016. No active disease. No dysplasia. Follow-up in 3 years for surveillance recommended.  REVIEW OF SYSTEMS:  All non-GI ROS negative except for back pain  Past Medical History:  Diagnosis Date  . Atrial fibrillation ([place])   . Colon polyp, hyperplastic   . Crohn's colitis ([place])   . Diverticulosis   . H/O: iron deficiency anemia   . Hemorrhoids     Past Surgical History:  Procedure Laterality Date  . FINGER SURGERY     Thumb ; right    Social History Donald Duncan  reports that he has quit smoking. His smoking use included Cigarettes. He has never used smokeless tobacco. He reports that he drinks alcohol. He reports that he does not use drugs.  family history includes Alzheimer's disease in his mother; Heart disease in his father and mother; Lung cancer in his father.  Allergies  Allergen Reactions  . Codeine Itching       PHYSICAL EXAMINATION: Vital signs: BP 110/64   Pulse 76   Ht 6' 2"  (1.88 m)   Wt 204 lb 12.8 oz (92.9 kg)   BMI 26.29  kg/m   Constitutional: generally well-appearing, no acute distress Psychiatric: alert and oriented x3, cooperative Eyes: extraocular movements intact, anicteric, conjunctiva pink Mouth: oral pharynx moist, no lesions Neck: supple no lymphadenopathy Cardiovascular: heart regular rate and rhythm, no murmur Lungs: clear to auscultation bilaterally Abdomen: soft, nontender, nondistended, no obvious ascites, no peritoneal signs, normal bowel sounds, no organomegaly Rectal:Omitted Extremities: no lower extremity edema bilaterally Skin: no lesions on visible extremitiesExcept for onychomycoses on several fingers nails Neuro: No focal deficits. Cranial nerves intact. No asterixis.  ASSESSMENT:  #1. Chronic long-standing indeterminate colitis. He remains asymptomatic on Remicade therapy. Last colonoscopy May 2016  PLAN:  #1. Continue Remicade infusion therapy #2. The patient is going to follow-up and see if he has had blood work and TB testing this year. If so, this should be forwarded. If not, this can be done through the infusion Center or this office. He understands. #3. Routine office follow-up one year. Sooner if needed #4. Surveillance colonoscopy around 2019

## 2016-06-09 ENCOUNTER — Emergency Department
Admission: EM | Admit: 2016-06-09 | Discharge: 2016-06-09 | Disposition: A | Discharge status: Home / Self Care | Payer: BLUE CROSS/BLUE SHIELD | Source: Home / Self Care | Attending: Family Medicine | Admitting: Family Medicine

## 2016-06-09 ENCOUNTER — Encounter: Payer: Self-pay | Admitting: *Deleted

## 2016-06-09 ENCOUNTER — Emergency Department (INDEPENDENT_AMBULATORY_CARE_PROVIDER_SITE_OTHER): Payer: BLUE CROSS/BLUE SHIELD

## 2016-06-09 DIAGNOSIS — K5730 Diverticulosis of large intestine without perforation or abscess without bleeding: Secondary | ICD-10-CM

## 2016-06-09 DIAGNOSIS — N201 Calculus of ureter: Secondary | ICD-10-CM

## 2016-06-09 DIAGNOSIS — I2510 Atherosclerotic heart disease of native coronary artery without angina pectoris: Secondary | ICD-10-CM

## 2016-06-09 DIAGNOSIS — N200 Calculus of kidney: Secondary | ICD-10-CM | POA: Diagnosis not present

## 2016-06-09 LAB — POCT URINALYSIS DIP (MANUAL ENTRY)
BILIRUBIN UA: NEGATIVE
BILIRUBIN UA: NEGATIVE mg/dL
Glucose, UA: NEGATIVE mg/dL
LEUKOCYTES UA: NEGATIVE
Nitrite, UA: NEGATIVE
PH UA: 5.5 (ref 5.0–8.0)
PROTEIN UA: NEGATIVE mg/dL
Spec Grav, UA: <=1.005 — AB (ref 1.010–1.025)
Urobilinogen, UA: 0.2 E.U./dL

## 2016-06-09 MED ORDER — OXYCODONE-ACETAMINOPHEN 5-325 MG PO TABS: 1.0000 | ORAL_TABLET | Freq: Four times a day (QID) | ORAL | 0 refills | Status: DC | PRN

## 2016-06-09 MED ORDER — TAMSULOSIN HCL 0.4 MG PO CAPS: 0.4000 mg | ORAL_CAPSULE | Freq: Every day | ORAL | 1 refills | Status: DC

## 2016-06-09 NOTE — Discharge Instructions (Signed)
Increase fluid intake.  Strain urine to collect stone. If symptoms become significantly worse during the night or over the weekend, proceed to the local emergency room.

## 2016-06-09 NOTE — ED Notes (Addendum)
Per Dr. Aurelio Brash request apt made with Urology @ [facility] in [place]. Apt made for today @ 2:20pm with Dr. Domingo Cocking. Apt time and location given to patient while in office. Sent CT disc with patient. Notes faxed.

## 2016-06-09 NOTE — ED Triage Notes (Signed)
Patient c/o 3 weeks of intermittent pelvic pain that was radiates to left flank. Lat night noted hemnatuira and nausea.

## 2016-06-09 NOTE — ED Provider Notes (Signed)
[facility]    CSN: [id number] Arrival date & time: 06/09/16  0941     History   Chief Complaint Chief Complaint  Patient presents with  . Pelvic Pain  . Flank Pain    HPI Donald Duncan is a 66 y.o. male.   Last night patient developed urinary urgency and suprapubic pain that radiated to his left lower back.  The pain lasted about one hour.  He noticed hematuria, and symptoms resolved after drinking large amounts of water.  He believes that he may have had low grade fever. He had a similar occurrence three weeks ago upon awakening.  At that time he believed that his ulcerative colitis may have been flaring up, but he had no associated loose stools.  His symptoms resolved after drinking large amounts of water. He spontaneously passed a kidney stone in 2002.  The stone was revealed on CT scan, and he followed up with a urologist.   The history is provided by the patient.  Pelvic Pain  This is a recurrent problem. The current episode started yesterday. The problem has been resolved. Associated symptoms include abdominal pain. Nothing aggravates the symptoms. Relieved by: drinking water. He has tried nothing for the symptoms.  Flank Pain  Associated symptoms include abdominal pain.    Past Medical History:  Diagnosis Date  . Atrial fibrillation ([place])   . Colon polyp, hyperplastic   . Crohn's colitis ([place])   . Diverticulosis   . H/O: iron deficiency anemia   . Hemorrhoids     Patient Active Problem List   Diagnosis Date Noted  . CROHN'S DISEASE, LARGE INTESTINE 10/07/2008    Past Surgical History:  Procedure Laterality Date  . FINGER SURGERY     Thumb ; right       Home Medications    Prior to Admission medications   Medication Sig Start Date End Date Taking? Authorizing Provider  apixaban (ELIQUIS) 5 MG TABS tablet Take 5 mg by mouth 2 (two) times daily.   Yes Historical Provider, MD  cholecalciferol (VITAMIN D) 1000 UNITS tablet Take 1,000 Units  by mouth daily.     Yes Historical Provider, MD  inFLIXimab (REMICADE) 100 MG injection Inject 5 mg/kg into the vein every 8 (eight) weeks.     Yes Historical Provider, MD  metoprolol tartrate (LOPRESSOR) 25 MG tablet TAKE ONE TABLET (25 MG TOTAL) BY MOUTH 2 (TWO) TIMES DAILY. 12/09/15  Yes Historical Provider, MD  Multiple Vitamins-Minerals (MULTIVITAMIN WITH MINERALS) tablet Take 1 tablet by mouth daily.    Historical Provider, MD  oxyCODONE-acetaminophen (ROXICET) 5-325 MG tablet Take 1 tablet by mouth every 6 (six) hours as needed for moderate pain or severe pain. 06/09/16   Kandra Nicolas, MD  tamsulosin (FLOMAX) 0.4 MG CAPS capsule Take 1 capsule (0.4 mg total) by mouth daily after supper. 06/09/16   Kandra Nicolas, MD    Family History Family History  Problem Relation Age of Onset  . Lung cancer Father   . Heart disease Father   . Heart disease Mother   . Alzheimer's disease Mother   . Colon cancer Neg Hx   . Esophageal cancer Neg Hx   . Rectal cancer Neg Hx   . Stomach cancer Neg Hx     Social History Social History  Substance Use Topics  . Smoking status: Former Smoker    Types: Cigarettes  . Smokeless tobacco: Never Used     Comment: Quit in 2000  . Alcohol  use Yes     Comment: Rarely once a year.     Allergies   Codeine   Review of Systems Review of Systems  Constitutional: Positive for activity change, appetite change, chills, diaphoresis and fatigue.  HENT: Negative.   Eyes: Negative.   Respiratory: Negative.   Cardiovascular: Negative.   Gastrointestinal: Positive for abdominal pain and nausea. Negative for blood in stool, constipation, diarrhea and vomiting.  Genitourinary: Positive for flank pain, hematuria, pelvic pain and urgency. Negative for decreased urine volume, difficulty urinating, dysuria and frequency.  Skin: Negative.      Physical Exam Triage Vital Signs ED Triage Vitals [06/09/16 1009]  Enc Vitals Group     BP 132/75     Pulse Rate  64     Resp      Temp 97.8 F (36.6 C)     Temp Source Oral     SpO2 97 %     Weight 209 lb (94.8 kg)     Height 6' (1.829 m)     Head Circumference      Peak Flow      Pain Score 0     Pain Loc      Pain Edu?      Excl. in [place]?    No data found.   Updated Vital Signs BP 132/75 (BP Location: Left Arm)   Pulse 64   Temp 97.8 F (36.6 C) (Oral)   Ht 6' (1.829 m)   Wt 209 lb (94.8 kg)   SpO2 97%   BMI 28.35 kg/m   Visual Acuity Right Eye Distance:   Left Eye Distance:   Bilateral Distance:    Right Eye Near:   Left Eye Near:    Bilateral Near:     Physical Exam Nursing notes and Vital Signs reviewed. Appearance:  Patient appears stated age, and in no acute distress.    Eyes:  Pupils are equal, round, and reactive to light and accomodation.  Extraocular movement is intact.  Conjunctivae are not inflamed   Pharynx:  Normal; moist mucous membranes  Neck:  Supple.  No adenopathy Lungs:  Clear to auscultation.  Breath sounds are equal.  Moving air well. Heart:  Regular rate and rhythm without murmurs, rubs, or gallops.  Abdomen:  Minimal suprapubic tenderness without masses or hepatosplenomegaly.  Bowel sounds are present.  No CVA or flank tenderness.  Extremities:  No edema.  Skin:  No rash present.     UC Treatments / Results  Labs (all labs ordered are listed, but only abnormal results are displayed) Labs Reviewed  POCT URINALYSIS DIP (MANUAL ENTRY) - Abnormal; Notable for the following:       Result Value   Spec Grav, UA <=1.005 (*)    Blood, UA moderate (*)    All other components within normal limits  URINE CULTURE    EKG  EKG Interpretation None       Radiology Ct Renal Stone Study  Result Date: 06/09/2016 CLINICAL DATA:  Left flank pain radiating to the back with hematuria. EXAM: CT ABDOMEN AND PELVIS WITHOUT CONTRAST TECHNIQUE: Multidetector CT imaging of the abdomen and pelvis was performed following the standard protocol without IV contrast.  COMPARISON:  None. FINDINGS: Lower chest:  RCA atherosclerotic calcification. Hepatobiliary: No focal liver abnormality.No evidence of biliary obstruction or stone. Pancreas: Unremarkable. Spleen: Unremarkable. Adrenals/Urinary Tract:  Negative adrenals. Stone in the distal left ureter, at the iliac crossing. On reformats this has a lobulated appearance and may be  2 adjacent stones or an irregular stone. Total maximal dimension is 6 mm. No additional urolithiasis. No right hydronephrosis. Unremarkable bladder. Stomach/Bowel: Diverticulosis of the sigmoid and descending colon. No appendicitis. Vascular/Lymphatic: No acute vascular abnormality. No mass or adenopathy. Reproductive:Negative Other: No ascites or pneumoperitoneum. Musculoskeletal: No acute abnormalities. Left femoral head avascular necrosis with subchondral cysts in the sclerotic femoral head. Lower lumbar degenerative disc narrowing. IMPRESSION: 1. Obstructing stone in the left ureter at the iliac crossing. There is either two adjacent stones or a single irregular stone. 2. Distal colonic diverticulosis. 3. AVN of the left femoral head. 4. Coronary atherosclerosis. Electronically Signed   By: Monte Fantasia M.D.   On: 06/09/2016 11:08    Procedures Procedures (including critical care time)  Medications Ordered in UC Medications - No data to display   Initial Impression / Assessment and Plan / UC Course  I have reviewed the triage vital signs and the nursing notes.  Pertinent labs & imaging results that were available during my care of the patient were reviewed by me and considered in my medical decision making (see chart for details).    Begin Flomax.  Rx for Percocet (#10, no refill). Increase fluid intake.  Strain urine to collect stone. If symptoms become significantly worse during the night or over the weekend, proceed to the local emergency room.  Followup with urologist in three days.    Final Clinical Impressions(s) / UC  Diagnoses   Final diagnoses:  Kidney stone    New Prescriptions New Prescriptions   OXYCODONE-ACETAMINOPHEN (ROXICET) 5-325 MG TABLET    Take 1 tablet by mouth every 6 (six) hours as needed for moderate pain or severe pain.   TAMSULOSIN (FLOMAX) 0.4 MG CAPS CAPSULE    Take 1 capsule (0.4 mg total) by mouth daily after supper.     Kandra Nicolas, MD 06/09/16 [phone]

## 2016-06-11 ENCOUNTER — Telehealth: Payer: Self-pay | Admitting: Emergency Medicine

## 2016-06-11 LAB — URINE CULTURE: Organism ID, Bacteria: NO GROWTH

## 2016-06-11 NOTE — Telephone Encounter (Signed)
Pt informed of results and states that he still hasn't passed any stones, feeling well.  [place]

## 2016-10-17 ENCOUNTER — Other Ambulatory Visit: Payer: Self-pay

## 2016-10-17 DIAGNOSIS — K50919 Crohn's disease, unspecified, with unspecified complications: Secondary | ICD-10-CM

## 2017-02-16 ENCOUNTER — Encounter: Payer: Self-pay | Admitting: Internal Medicine

## 2017-05-15 ENCOUNTER — Encounter (INDEPENDENT_AMBULATORY_CARE_PROVIDER_SITE_OTHER): Payer: Self-pay

## 2017-05-15 ENCOUNTER — Encounter: Payer: Self-pay | Admitting: Internal Medicine

## 2017-05-15 ENCOUNTER — Ambulatory Visit: Payer: BLUE CROSS/BLUE SHIELD | Admitting: Internal Medicine

## 2017-05-15 VITALS — BP 124/84 | HR 88 | Ht 74.0 in | Wt 201.0 lb

## 2017-05-15 DIAGNOSIS — Z7901 Long term (current) use of anticoagulants: Secondary | ICD-10-CM

## 2017-05-15 DIAGNOSIS — K51019 Ulcerative (chronic) pancolitis with unspecified complications: Secondary | ICD-10-CM | POA: Diagnosis not present

## 2017-05-15 DIAGNOSIS — K50119 Crohn's disease of large intestine with unspecified complications: Secondary | ICD-10-CM

## 2017-05-15 NOTE — Progress Notes (Signed)
HISTORY OF PRESENT ILLNESS:  Donald Duncan is a 67 y.o. male with long-standing indeterminate colitis, most likely Crohn's disease. For this he has been maintained on Remicade 5 mg/kg every 8 weeks the outpatient transfusion center elsewhere. He was last evaluated 01/17/2016. At that time he was doing well. Routine follow-up in 1 year recommended. Outside blood work and TB testing at the transfusion center has been unremarkable. He continues on Eliquis for history of atrial fibrillation. He plans on seeing his cardiologist in the upcoming months. His last surveillance colonoscopy was May 2016. Unremarkable colonic mucosa which was negative for dysplasia. Follow-up in 3 years recommended. Patient is pleased report that he remains asymptomatic. He tolerates his infusion therapy. He has had some arthritis particularly in his hip but no other complaints.  REVIEW OF SYSTEMS:  All non-GI ROS negative except for arthritis, back pain, muscle cramps  Past Medical History:  Diagnosis Date  . Atrial fibrillation ([place])   . Colon polyp, hyperplastic   . Crohn's colitis ([place])   . Diverticulosis   . H/O: iron deficiency anemia   . Hemorrhoids     Past Surgical History:  Procedure Laterality Date  . FINGER SURGERY     Thumb ; right    Social History Donald Duncan  reports that he has quit smoking. His smoking use included cigarettes. He has never used smokeless tobacco. He reports that he drinks alcohol. He reports that he does not use drugs.  family history includes Alzheimer's disease in his mother; Heart disease in his father and mother; Lung cancer in his father.  Allergies  Allergen Reactions  . Codeine Itching       PHYSICAL EXAMINATION: Vital signs: BP 124/84   Pulse 88   Ht 6' 2"  (1.88 m)   Wt 201 lb (91.2 kg)   SpO2 98%   BMI 25.81 kg/m   Constitutional:pleasant, generally well-appearing, no acute distress Psychiatric: alert and oriented x3, cooperative Eyes: extraocular  movements intact, anicteric, conjunctiva pink Mouth: oral pharynx moist, no lesions Neck: supple no lymphadenopathy Cardiovascular: heart regular rate and rhythm, no murmur Lungs: clear to auscultation bilaterally Abdomen: soft, nontender, nondistended, no obvious ascites, no peritoneal signs, normal bowel sounds, no organomegaly Rectal:deferred until colonoscopy Extremities: no clubbing cyanosis or lower extremity edema bilaterally Skin: no lesions on visible extremities Neuro: No focal deficits. Cranial nerves intact  ASSESSMENT:  #1. Long-standing indeterminate colitis. Doing well on long-term Remicade therapy. Blood work being monitored at his infusion center #2. Last colonoscopy May 2016. Due for surveillance this year #3. On Eliquis for history of atrial fibrillation   PLAN:  #1. Continue Remicade 5 mg for kilogram every 8 weeks #2. Schedule surveillance colonoscopy this year. The patient would need to hold his Eliquis for 2 days prior to the procedure. This needs to be confirmed with his cardiologist. He will contact the office and schedule a previsit evaluation when he is ready to set up his colonoscopy. He does not need to see me in the office can if he is not having any new issues. The patient is high-risk given his chronic anti-coagulation status

## 2017-05-15 NOTE — Patient Instructions (Signed)
Call us later in the year when you are ready to schedule a colonoscopy

## 2017-06-20 ENCOUNTER — Encounter: Payer: Self-pay | Admitting: Internal Medicine

## 2017-09-20 ENCOUNTER — Telehealth: Payer: Self-pay | Admitting: Internal Medicine

## 2017-09-24 NOTE — Telephone Encounter (Signed)
Orders faxed per request.

## 2017-09-30 IMAGING — CT CT RENAL STONE PROTOCOL
2 of 4 series · 16 of 46 positions shown, 18 images · non-contrast
Comparison: None.

CLINICAL DATA: Left flank pain radiating to the back with
hematuria.

EXAM:
CT ABDOMEN AND PELVIS WITHOUT CONTRAST
TECHNIQUE: Multidetector CT imaging of the abdomen and pelvis was performed
following the standard protocol without IV contrast.

[Series 2: axial st · axial · 0.98mm/px · z∈[-620,-130]mm · 13 of 108 slices shown, 15 images]
[im 5/108  soft-tissue]
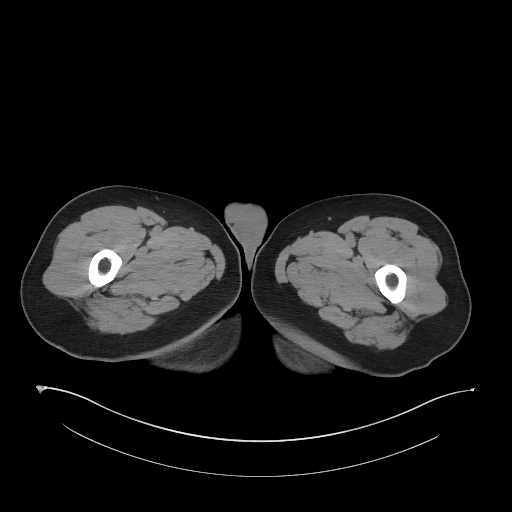
[im 5/108  bone]
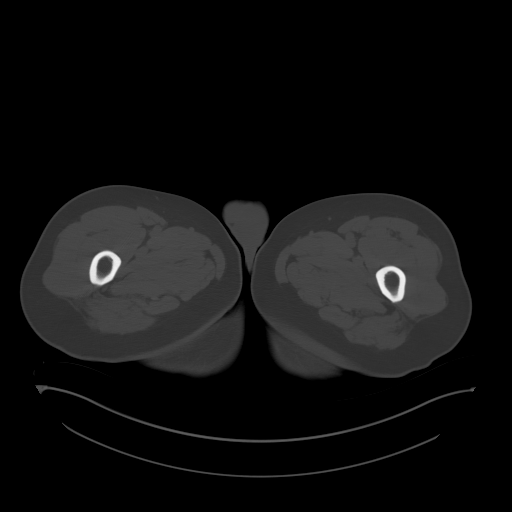
[im 14/108  soft-tissue]
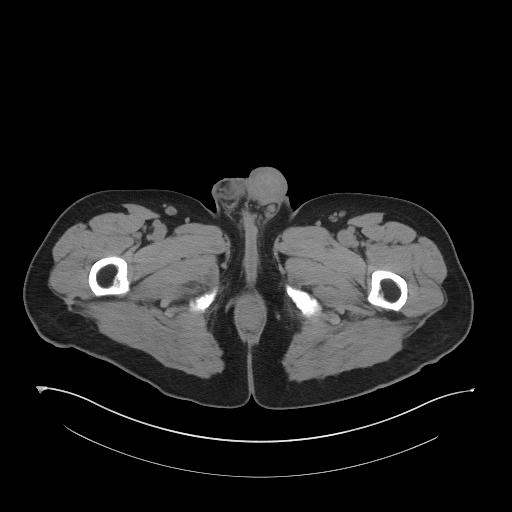
[im 23/108  soft-tissue]
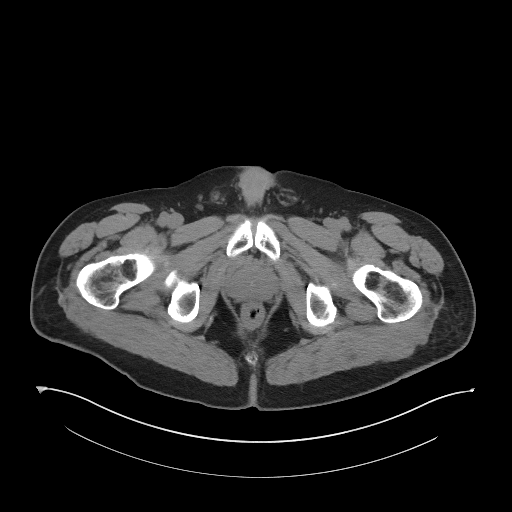
[im 32/108  soft-tissue]
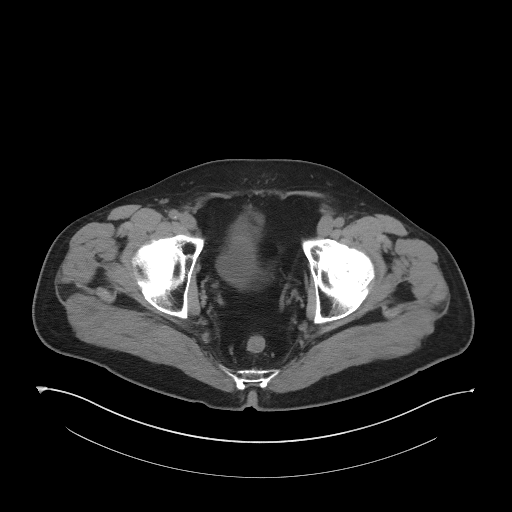
[im 36/108  soft-tissue]
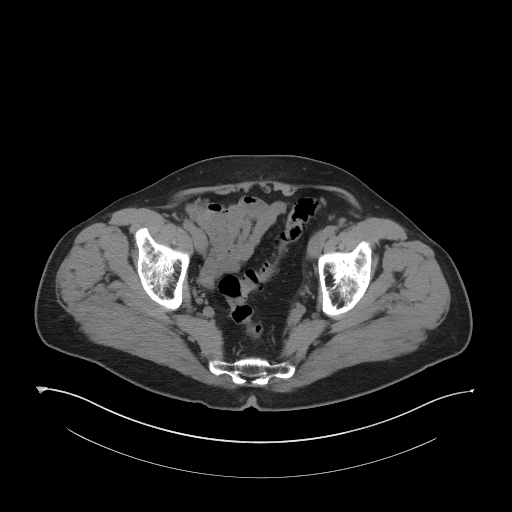
[im 45/108  soft-tissue]
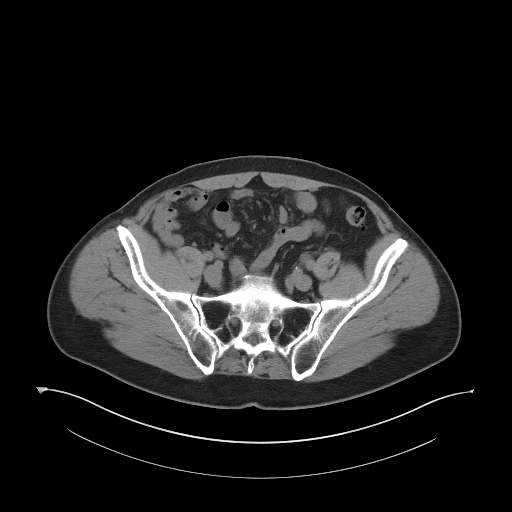
[im 54/108  soft-tissue]
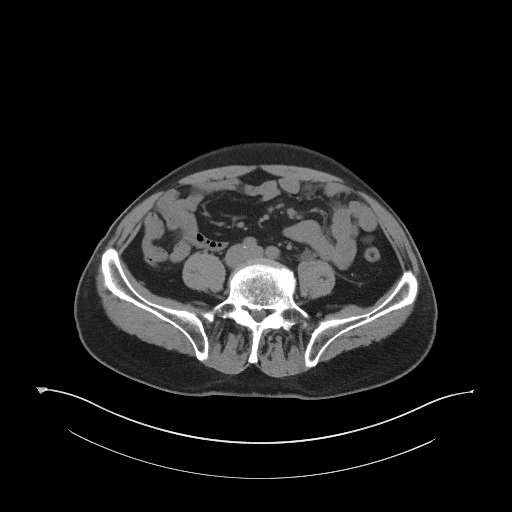
[im 63/108  soft-tissue]
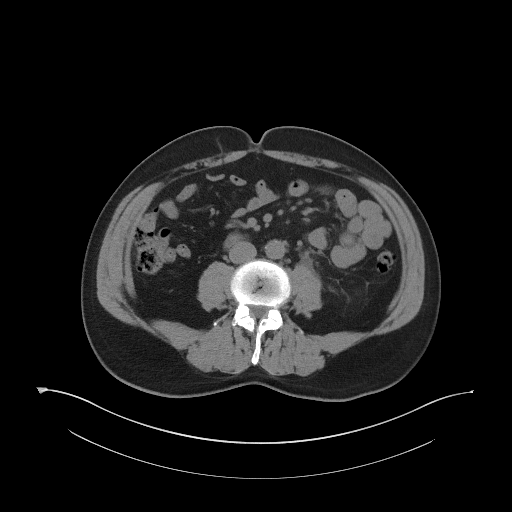
[im 72/108  soft-tissue]
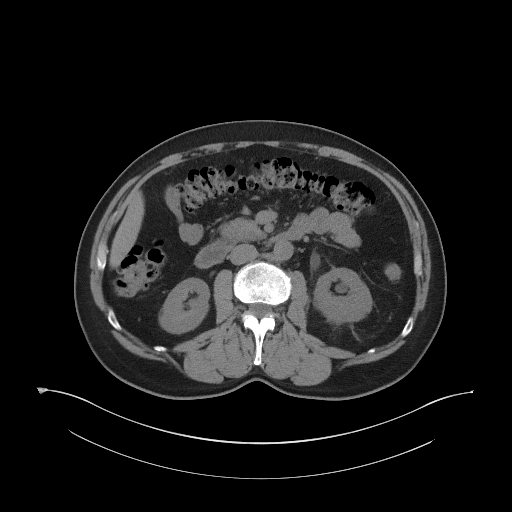
[im 72/108  bone]
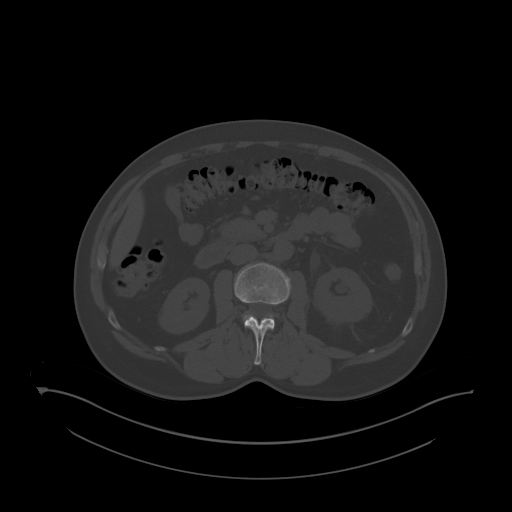
[im 76/108  soft-tissue]
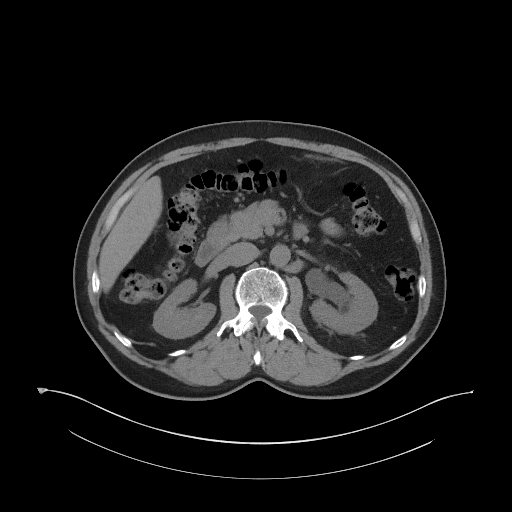
[im 85/108  soft-tissue]
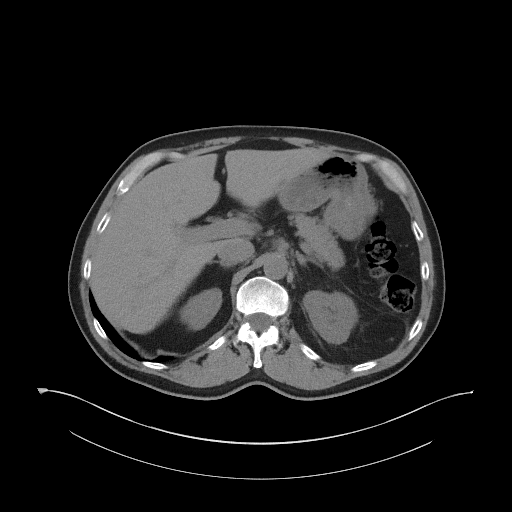
[im 94/108  soft-tissue]
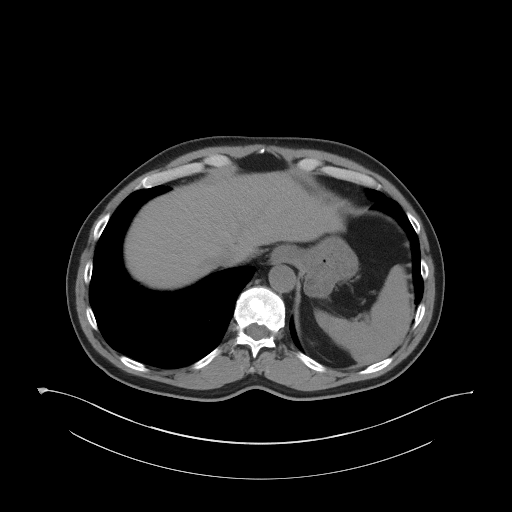
[im 103/108  soft-tissue]
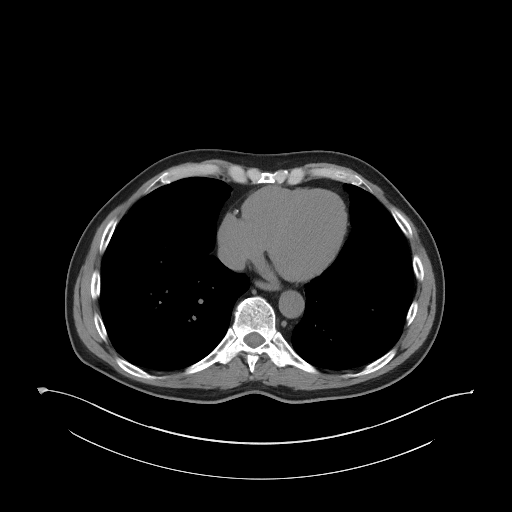

[Series 4: coronal st · coronal · 0.89mm/px · 3 of 98 slices shown]
[im 33/98  soft-tissue]
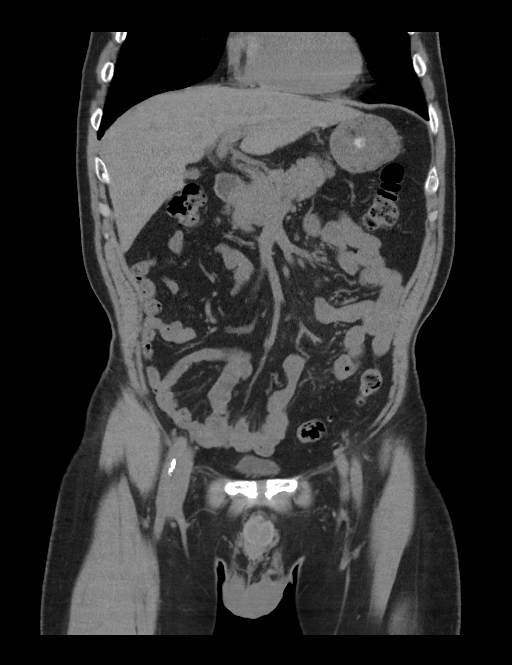
[im 44/98  soft-tissue]
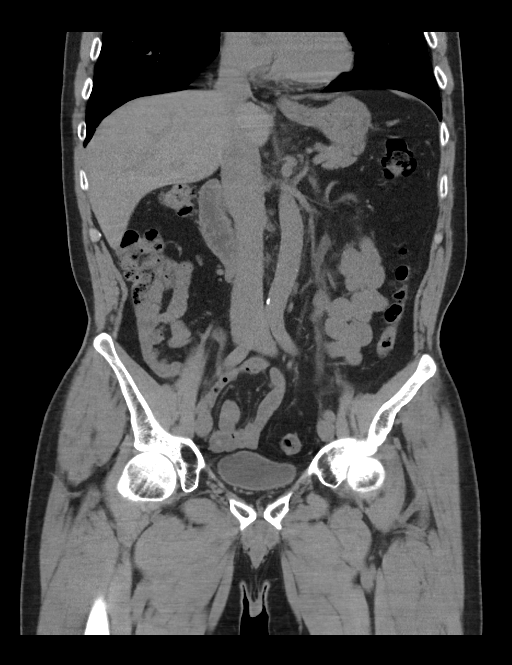
[im 54/98  soft-tissue]
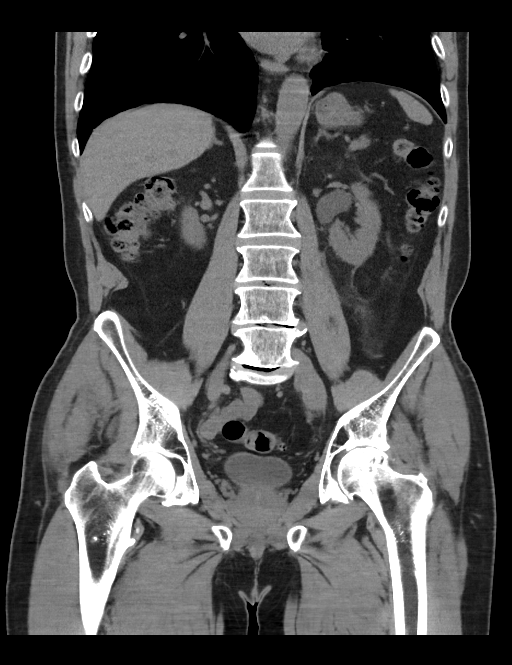

[16 of 46 positions shown; findings below may reference images not displayed]

FINDINGS: Lower chest:  RCA atherosclerotic calcification.

Hepatobiliary: No focal liver abnormality.No evidence of biliary
obstruction or stone.

Pancreas: Unremarkable.

Spleen: Unremarkable.

Adrenals/Urinary Tract:  Negative adrenals.

Stone in the distal left ureter, at the iliac crossing. On reformats
this has a lobulated appearance and [DATE] adjacent stones or an
irregular stone. Total maximal dimension is 6 mm. No additional
urolithiasis. No right hydronephrosis.

Unremarkable bladder.

Stomach/Bowel: Diverticulosis of the sigmoid and descending colon.
No appendicitis.

Vascular/Lymphatic: No acute vascular abnormality. No mass or
adenopathy.

Reproductive:Negative

Other: No ascites or pneumoperitoneum.

Musculoskeletal: No acute abnormalities. Left femoral head avascular
necrosis with subchondral cysts in the sclerotic femoral head. Lower
lumbar degenerative disc narrowing.
IMPRESSION: 1. Obstructing stone in the left ureter at the iliac crossing. There
is either two adjacent stones or a single irregular stone.
2. Distal colonic diverticulosis.
3. AVN of the left femoral head.
4. Coronary atherosclerosis.

## 2017-10-19 ENCOUNTER — Telehealth: Payer: Self-pay | Admitting: Emergency Medicine

## 2017-10-19 ENCOUNTER — Encounter: Payer: Self-pay | Admitting: Gastroenterology

## 2017-10-19 ENCOUNTER — Ambulatory Visit (INDEPENDENT_AMBULATORY_CARE_PROVIDER_SITE_OTHER): Payer: Medicare Other | Admitting: Gastroenterology

## 2017-10-19 ENCOUNTER — Other Ambulatory Visit: Payer: Self-pay | Admitting: Internal Medicine

## 2017-10-19 VITALS — BP 120/68 | HR 71 | Ht 74.0 in | Wt 202.0 lb

## 2017-10-19 DIAGNOSIS — Z7901 Long term (current) use of anticoagulants: Secondary | ICD-10-CM | POA: Diagnosis not present

## 2017-10-19 DIAGNOSIS — K50119 Crohn's disease of large intestine with unspecified complications: Secondary | ICD-10-CM

## 2017-10-19 MED ORDER — PEG-KCL-NACL-NASULF-NA ASC-C 140 G PO SOLR: 1.0000 | ORAL | 0 refills | Status: DC

## 2017-10-19 NOTE — Telephone Encounter (Signed)
   KURK CORNIEL Aug 26, 1950 [id number]  Dear Dr. Mauricio Po:  We have scheduled the above named patient for a colonoscopy procedure. Our records show that he is on anticoagulation therapy.  Please advise as to whether the patient may come off their therapy of Eliquis 2 days prior to their procedure which is scheduled for 12-12-17.  Please route your response to Tinnie Gens, CMA or fax response to [phone].  Sincerely,    [facility]

## 2017-10-19 NOTE — Patient Instructions (Signed)

## 2017-10-19 NOTE — Progress Notes (Signed)
Assessment and plans reviewed  

## 2017-10-19 NOTE — Progress Notes (Signed)
10/19/2017 Donald Duncan [id number] 1950/03/31   HISTORY OF PRESENT ILLNESS:  This is a 67 year old male with long-standing indeterminate colitis, most likely Crohn's disease. For this he has been maintained on Remicade 5 mg/kg every 8 weeks the outpatient transfusion center elsewhere.  At that time he was doing well.  Having 1-3 mostly formed BM's daily.  No rectal bleeding.  He continues on Eliquis for history of atrial fibrillation that is prescribed by cardiologist, Dr. Mauricio Po.  His last surveillance colonoscopy was May 2016. Unremarkable colonic mucosa which was negative for dysplasia. Follow-up in 3 years recommended. Patient is pleased report that he remains asymptomatic. He tolerates his infusion therapy.   Past Medical History:  Diagnosis Date  . Atrial fibrillation ([place])   . Colon polyp, hyperplastic   . Crohn's colitis ([place])   . Diverticulosis   . H/O: iron deficiency anemia   . Hemorrhoids    Past Surgical History:  Procedure Laterality Date  . FINGER SURGERY     Thumb ; right    reports that he has quit smoking. His smoking use included cigarettes. He has never used smokeless tobacco. He reports that he drinks alcohol. He reports that he does not use drugs. family history includes Alzheimer's disease in his mother; Heart disease in his father and mother; Lung cancer in his father. Allergies  Allergen Reactions  . Codeine Itching      Outpatient Encounter Medications as of 10/19/2017  Medication Sig  . apixaban (ELIQUIS) 5 MG TABS tablet Take 5 mg by mouth 2 (two) times daily.  . cholecalciferol (VITAMIN D) 1000 UNITS tablet Take 1,000 Units by mouth daily.    Marland Kitchen inFLIXimab (REMICADE) 100 MG injection Inject 5 mg/kg into the vein every 8 (eight) weeks.    . metoprolol tartrate (LOPRESSOR) 25 MG tablet TAKE ONE TABLET (25 MG TOTAL) BY MOUTH 2 (TWO) TIMES DAILY.  . Multiple Vitamins-Minerals (MULTIVITAMIN WITH MINERALS) tablet Take 1 tablet by mouth daily.  .  [DISCONTINUED] dabigatran (PRADAXA) 150 MG CAPS Take 150 mg by mouth every 12 (twelve) hours. Twice daily    No facility-administered encounter medications on file as of 10/19/2017.      REVIEW OF SYSTEMS  : All other systems reviewed and negative except where noted in the History of Present Illness.   PHYSICAL EXAM: BP 120/68   Pulse 71   Ht 6' 2"  (1.88 m)   Wt 202 lb (91.6 kg)   BMI 25.94 kg/m  General: Well developed white male in no acute distress Head: Normocephalic and atraumatic Eyes:  Sclerae anicteric, conjunctiva pink. Ears: Normal auditory acuity Lungs: Clear throughout to auscultation; no W/R/R. Heart: Regular rate and rhythm; no M/R/G. Abdomen: Soft, non-distended.  BS present.  Non-tender. Rectal:  Will be done at the time of colonoscopy. Musculoskeletal: Symmetrical with no gross deformities  Skin: No lesions on visible extremities Extremities: No edema  Neurological: Alert oriented x 4, grossly non-focal Psychological:  Alert and cooperative. Normal mood and affect  ASSESSMENT AND PLAN: *Crohn's disease/long-standing indeterminate colitis:  Well controlled on long-term Remicade therapy.  Last colonoscopy 06/2014.  Due for surveillance.  Will schedule with Dr. Henrene Pastor. *Chronic anticoagulation with Eliquis for atrial fibrillation:  Will hold Eliquis for 2 days prior to endoscopic procedures - will instruct when and how to resume after procedure. Benefits and risks of procedure explained including risks of bleeding, perforation, infection, missed lesions, reactions to medications and possible need for hospitalization and surgery for complications.  Additional rare but real risk of stroke or other vascular clotting events off of Eliquis also explained and need to seek urgent help if any signs of these problems occur. Will communicate by phone or EMR with patient's prescribing provider, Dr. Mauricio Po, to confirm that holding Eliquis is reasonable in this case.    CC:  No ref.  provider found

## 2017-12-07 ENCOUNTER — Other Ambulatory Visit: Payer: Self-pay | Admitting: Internal Medicine

## 2017-12-07 NOTE — Telephone Encounter (Signed)
Patients wife states plenvu prep for patients colon on 10.30.19 is not covered by insurance. Patients wife requesting something else called in. Patient uses [facility] in [place]. Patient was seen for ov by APP Jessica on 9.6.19.

## 2017-12-07 NOTE — Telephone Encounter (Signed)
Spoke to nurse at Dr. Shearon Stalls office and he has already confirmed patient can stay off Eliquis 2 days prior to his procedure. Patient was informed. She will send over the documentation so it can be scanned.

## 2017-12-10 ENCOUNTER — Telehealth: Payer: Self-pay | Admitting: Internal Medicine

## 2017-12-10 NOTE — Telephone Encounter (Signed)
Called pharmacy and had them run Suprep through [employer].  This came back at $48.  Called patient to let him know and asked him to come pick up new instructions for the Suprep.  Patient agreed.

## 2017-12-10 NOTE — Telephone Encounter (Signed)
Pt need another prep, his procedure in on 12/12/17 @ 8:00am

## 2017-12-12 ENCOUNTER — Encounter: Payer: Self-pay | Admitting: Internal Medicine

## 2017-12-12 ENCOUNTER — Ambulatory Visit (AMBULATORY_SURGERY_CENTER): Payer: Medicare Other | Admitting: Internal Medicine

## 2017-12-12 VITALS — BP 107/69 | HR 67 | Temp 98.0°F | Resp 16 | Ht 74.0 in | Wt 202.0 lb

## 2017-12-12 DIAGNOSIS — D125 Benign neoplasm of sigmoid colon: Secondary | ICD-10-CM

## 2017-12-12 DIAGNOSIS — K50119 Crohn's disease of large intestine with unspecified complications: Secondary | ICD-10-CM | POA: Diagnosis present

## 2017-12-12 DIAGNOSIS — K599 Functional intestinal disorder, unspecified: Secondary | ICD-10-CM

## 2017-12-12 DIAGNOSIS — K635 Polyp of colon: Secondary | ICD-10-CM

## 2017-12-12 MED ORDER — SODIUM CHLORIDE 0.9 % IV SOLN: 500.0000 mL | Freq: Once | INTRAVENOUS | Status: DC

## 2017-12-12 NOTE — Progress Notes (Signed)
A and O x3. Report to RN. Tolerated MAC anesthesia well.

## 2017-12-12 NOTE — Op Note (Signed)
[facility] Patient Name: Donald Duncan Procedure Date: 12/12/2017 8:01 AM MRN: [id number] Endoscopist: Docia Chuck. Henrene Pastor , MD Age: 67 Referring MD:  Date of Birth: 1950/06/25 Gender: Male Account #: [id number] Procedure:                Colonoscopy with biopsies and cold snare                            polypectomy x2 Indications:              High risk colon cancer surveillance: Ulcerative                            pancolitis versus indeterminate colitis of 8 (or                            more) years duration, High risk colon cancer                            surveillance: Ulcerative left sided colitis versus                            indeterminate colitis of 15 (or more) years                            duration. Patient has been on long-term Remicade.                            Last examination May 2016. Medicines:                Monitored Anesthesia Care Procedure:                Pre-Anesthesia Assessment:                           - Prior to the procedure, a History and Physical                            was performed, and patient medications and                            allergies were reviewed. The patient's tolerance of                            previous anesthesia was also reviewed. The risks                            and benefits of the procedure and the sedation                            options and risks were discussed with the patient.                            All questions were answered, and informed consent  was obtained. Prior Anticoagulants: The patient has                            taken Eliquis (apixaban), last dose was 2 days                            prior to procedure. ASA Grade Assessment: II - A                            patient with mild systemic disease. After reviewing                            the risks and benefits, the patient was deemed in                            satisfactory condition to undergo the  procedure.                           After obtaining informed consent, the colonoscope                            was passed under direct vision. Throughout the                            procedure, the patient's blood pressure, pulse, and                            oxygen saturations were monitored continuously. The                            Colonoscope was introduced through the anus and                            advanced to the the cecum, identified by                            appendiceal orifice and ileocecal valve. The                            ileocecal valve, appendiceal orifice, and rectum                            were photographed. The quality of the bowel                            preparation was good. The colonoscopy was performed                            without difficulty. The patient tolerated the                            procedure well. The bowel preparation used was  SUPREP. Scope In: 8:28:18 AM Scope Out: 8:44:06 AM Scope Withdrawal Time: 0 hours 13 minutes 21 seconds  Total Procedure Duration: 0 hours 15 minutes 48 seconds  Findings:                 Multiple polyps were found in the sigmoid colon.                            These were consistent with pseudopolyps.. The                            polyps were 2 to 7 mm in size. 2 representative                            polyps were removed with a cold snare. Resection                            and retrieval were complete.                           Multiple diverticula were found in the entire colon.                           There was scarring and diminished features in the                            left colon in particular. No active inflammation                            appreciated. The entire examined colon appeared                            otherwise normal on direct and retroflexion views.                            Four biopsies were taken every 10 cm with a cold                             forceps from the entire colon for ulcerative                            colitis surveillance. These biopsy specimens were                            sent to Pathology. Complications:            No immediate complications. Estimated blood loss:                            None. Estimated Blood Loss:     Estimated blood loss: none. Impression:               - Multiple pseudopolyps. Several representatives                            removed with  a cold snare. Resected and retrieved.                           - Diverticulosis in the entire examined colon.                           -No active inflammation. The entire examined colon                            is otherwise normal on direct and retroflexion                            views. Surveillance biopsies obtained. Recommendation:           - Repeat colonoscopy in 3 years for surveillance if                            no dysplasia on biopsies.                           - Resume Eliquis (apixaban) today at prior dose.                           - Patient has a contact number available for                            emergencies. The signs and symptoms of potential                            delayed complications were discussed with the                            patient. Return to normal activities tomorrow.                            Written discharge instructions were provided to the                            patient.                           - Resume previous diet.                           - Continue present medications.                           - Await pathology results.                           - Routine office follow-up with Dr. Henrene Pastor in 1 year Docia Chuck. Henrene Pastor, MD 12/12/2017 8:59:31 AM This report has been signed electronically.

## 2017-12-12 NOTE — Patient Instructions (Addendum)
Resume Eliquis today at prior dose.  YOU HAD AN ENDOSCOPIC PROCEDURE TODAY AT [facility]:   Refer to the procedure report that was given to you for any specific questions about what was found during the examination.  If the procedure report does not answer your questions, please call your gastroenterologist to clarify.  If you requested that your care partner not be given the details of your procedure findings, then the procedure report has been included in a sealed envelope for you to review at your convenience later.  YOU SHOULD EXPECT: Some feelings of bloating in the abdomen. Passage of more gas than usual.  Walking can help get rid of the air that was put into your GI tract during the procedure and reduce the bloating. If you had a lower endoscopy (such as a colonoscopy or flexible sigmoidoscopy) you may notice spotting of blood in your stool or on the toilet paper. If you underwent a bowel prep for your procedure, you may not have a normal bowel movement for a few days.  Please Note:  You might notice some irritation and congestion in your nose or some drainage.  This is from the oxygen used during your procedure.  There is no need for concern and it should clear up in a day or so.  SYMPTOMS TO REPORT IMMEDIATELY:   Following lower endoscopy (colonoscopy or flexible sigmoidoscopy):  Excessive amounts of blood in the stool  Significant tenderness or worsening of abdominal pains  Swelling of the abdomen that is new, acute  Fever of 100F or higher   For urgent or emergent issues, a gastroenterologist can be reached at any hour by calling [phone].  Please see handout on polyps.  DIET:  We do recommend a small meal at first, but then you may proceed to your regular diet.  Drink plenty of fluids but you should avoid alcoholic beverages for 24 hours.  ACTIVITY:  You should plan to take it easy for the rest of today and you should NOT DRIVE or use heavy machinery until  tomorrow (because of the sedation medicines used during the test).    FOLLOW UP: Our staff will call the number listed on your records the next business day following your procedure to check on you and address any questions or concerns that you may have regarding the information given to you following your procedure. If we do not reach you, we will leave a message.  However, if you are feeling well and you are not experiencing any problems, there is no need to return our call.  We will assume that you have returned to your regular daily activities without incident.  If any biopsies were taken you will be contacted by phone or by letter within the next 1-3 weeks.  Please call us at [phone] if you have not heard about the biopsies in 3 weeks.    SIGNATURES/CONFIDENTIALITY: You and/or your care partner have signed paperwork which will be entered into your electronic medical record.  These signatures attest to the fact that that the information above on your After Visit Summary has been reviewed and is understood.  Full responsibility of the confidentiality of this discharge information lies with you and/or your care-partner.   Thank you for allowing [place] to provide your healthcare today.

## 2017-12-13 ENCOUNTER — Telehealth: Payer: Self-pay | Admitting: *Deleted

## 2017-12-13 NOTE — Telephone Encounter (Signed)
  Follow up Call-  Call back number 12/12/2017  Post procedure Call Back phone  # [phone]  Permission to leave phone message Yes  Some recent data might be hidden     Patient questions:  Do you have a fever, pain , or abdominal swelling? No. Pain Score  0 *  Have you tolerated food without any problems? Yes.    Have you been able to return to your normal activities? Yes.    Do you have any questions about your discharge instructions: Diet   No. Medications  No. Follow up visit  No.  Do you have questions or concerns about your Care? No.  Actions: * If pain score is 4 or above: No action needed, pain <4.

## 2017-12-13 NOTE — Telephone Encounter (Signed)
  Follow up Call-  Call back number 12/12/2017  Post procedure Call Back phone  # [phone]  Permission to leave phone message Yes  Some recent data might be hidden     Patient questions:  Do you have a fever, pain , or abdominal swelling? No. Pain Score  0 *  Have you tolerated food without any problems? Yes.    Have you been able to return to your normal activities? Yes.    Do you have any questions about your discharge instructions: Diet   No. Medications  No. Follow up visit  No.  Do you have questions or concerns about your Care? No.  Actions: * If pain score is 4 or above: No action needed, pain <4.

## 2017-12-14 ENCOUNTER — Encounter: Payer: Self-pay | Admitting: Internal Medicine

## 2018-08-05 ENCOUNTER — Telehealth: Payer: Self-pay | Admitting: Internal Medicine

## 2018-08-05 NOTE — Telephone Encounter (Signed)
Returned call to pt, he was driving and states he will call back.

## 2018-08-06 NOTE — Telephone Encounter (Signed)
Spoke with pt and he has not been able to get in touch with [facility]-it is not part of [facility]. This RN tried to call the infusion center and per the person that answered the phone they are closed on Mon and Tues due to the pandemic and open wed-Friday. Pt will try to call them tomorrow. If he has issues he will call back and at that point may want to change his infusions to [facility].

## 2019-10-07 ENCOUNTER — Encounter: Payer: Self-pay | Admitting: Internal Medicine

## 2019-10-07 ENCOUNTER — Ambulatory Visit (INDEPENDENT_AMBULATORY_CARE_PROVIDER_SITE_OTHER): Payer: Medicare Other | Admitting: Internal Medicine

## 2019-10-07 VITALS — BP 110/70 | HR 62 | Ht 74.0 in | Wt 210.0 lb

## 2019-10-07 DIAGNOSIS — K50119 Crohn's disease of large intestine with unspecified complications: Secondary | ICD-10-CM | POA: Diagnosis not present

## 2019-10-07 NOTE — Patient Instructions (Signed)
Please follow up in one year

## 2019-10-07 NOTE — Progress Notes (Signed)
HISTORY OF PRESENT ILLNESS:  Donald Duncan is a 69 y.o. male with longstanding indeterminate colitis, most likely Crohn's disease.  For this he has been maintained on Remicade 5 mg/kg every 8 weeks as an outpatient at an outpatient transfusion center elsewhere.  He was last evaluated in this office September 2019 prior to surveillance colonoscopy.  He also has a history of atrial fibrillation for which she is on Eliquis.  His surveillance colonoscopy was performed December 12, 2017.  Examination revealed multiple pseudopolyps and diverticulosis.  No active inflammation.  Biopsies revealed benign colonic mucosa without inflammation or dysplasia.  Routine follow-up colonoscopy in 3 years recommended.  Presents today for routine office evaluation.  He is pleased to report that he is asymptomatic.  No change in bowel habits, bleeding, or abdominal pain.  His weight has been stable.  No extraintestinal manifestations of inflammatory bowel disease are apparent on questioning.  Most recent blood work from September 22, 2019 was unremarkable including negative QuantiFERON gold testing.  Hemoglobin 14.3.  Comprehensive metabolic panel unremarkable including normal liver tests.  REVIEW OF SYSTEMS:  All non-GI ROS negative unless otherwise stated in the HPI except for intermittent hip discomfort  Past Medical History:  Diagnosis Date  . Atrial fibrillation ([place])   . Colon polyp, hyperplastic   . Crohn's colitis ([place])   . Diverticulosis   . H/O: iron deficiency anemia   . Hemorrhoids     Past Surgical History:  Procedure Laterality Date  . FINGER SURGERY     Thumb ; right    Social History Donald Duncan  reports that he has quit smoking. His smoking use included cigarettes. He has never used smokeless tobacco. He reports current alcohol use. He reports that he does not use drugs.  family history includes Alzheimer's disease in his mother; Heart disease in his father and mother; Lung cancer in his  father.  Allergies  Allergen Reactions  . Codeine Itching       PHYSICAL EXAMINATION: Vital signs: BP 110/70   Pulse 62   Ht 6' 2"  (1.88 m)   Wt 210 lb (95.3 kg)   BMI 26.96 kg/m   Constitutional: generally well-appearing, no acute distress Psychiatric: alert and oriented x3, cooperative Eyes: extraocular movements intact, anicteric, conjunctiva pink Mouth: oral pharynx moist, no lesions Neck: supple no lymphadenopathy Cardiovascular: heart regular rate and rhythm, no murmur Lungs: clear to auscultation bilaterally Abdomen: soft, nontender, nondistended, no obvious ascites, no peritoneal signs, normal bowel sounds, no organomegaly Rectal: Omitted Extremities: no clubbing, cyanosis, or lower extremity edema bilaterally Skin: no lesions on visible extremities Neuro: No focal deficits.  Cranial nerves intact  ASSESSMENT:  1.  Indeterminate colitis.  The patient remains in deep remission clinically and histologically on Remicade therapy.  Last colonoscopy October 2019 2.  History of atrial fibrillation on Eliquis 3.  Status post Covid vaccination series recent booster  PLAN:  1.  Continue Remicade therapy 5 mg/kg IV every 8 weeks 2.  Routine GI follow-up 1 year 3.  Surveillance colonoscopy with biopsies in 1 year (3 years from previous exam). 4.  Contact the office in the interim for any questions or problems 5.  Discussed the benefit of Covid vaccine booster.  He received this yesterday. Total time of 30 minutes was spent preparing to see the patient, reviewing outside blood work and pathology, obtaining comprehensive history and performing medically appropriate physical examination.  Also counseled the patient regarding his above listed issues, ordering ongoing medical therapy, discussing  surveillance examination intervals, documenting clinical information in the health record

## 2020-11-24 ENCOUNTER — Emergency Department (INDEPENDENT_AMBULATORY_CARE_PROVIDER_SITE_OTHER)
Admission: EM | Admit: 2020-11-24 | Discharge: 2020-11-24 | Disposition: A | Discharge status: Home / Self Care | Payer: Medicare Other | Source: Home / Self Care

## 2020-11-24 ENCOUNTER — Other Ambulatory Visit: Payer: Self-pay

## 2020-11-24 DIAGNOSIS — N3001 Acute cystitis with hematuria: Secondary | ICD-10-CM

## 2020-11-24 LAB — POCT URINALYSIS DIP (MANUAL ENTRY)
Bilirubin, UA: NEGATIVE
Glucose, UA: NEGATIVE mg/dL
Ketones, POC UA: NEGATIVE mg/dL
Leukocytes, UA: NEGATIVE
Nitrite, UA: NEGATIVE
Protein Ur, POC: NEGATIVE mg/dL
Spec Grav, UA: 1.02 (ref 1.010–1.025)
Urobilinogen, UA: 0.2 E.U./dL
pH, UA: 7 (ref 5.0–8.0)

## 2020-11-24 MED ORDER — SULFAMETHOXAZOLE-TRIMETHOPRIM 800-160 MG PO TABS: 1.0000 | ORAL_TABLET | Freq: Two times a day (BID) | ORAL | 0 refills | Status: AC

## 2020-11-24 NOTE — ED Triage Notes (Signed)
Pt c/o hematuria x 10 days. Some burning with urination. Hx of UTIs and kidney stones.

## 2020-11-24 NOTE — Discharge Instructions (Addendum)
Advised patient to take medication as directed with food to completion.  Encouraged patient to increase daily water intake while taking this medication.  Advised patient we will follow-up with urine culture results once received. °

## 2020-11-24 NOTE — ED Provider Notes (Signed)
[facility]    CSN: [id number] Arrival date & time: 11/24/20  0930      History   Chief Complaint Chief Complaint  Patient presents with   Hematuria    HPI Donald Duncan is a 70 y.o. male.   HPI 70 year old male presents with hematuria x10 days.  Reports dysuria and history of UTIs with kidney stones.  PMH significant for A. fib and chronic anticoagulation, currently on Apixaban 5 mg daily.  Past Medical History:  Diagnosis Date   Atrial fibrillation ([place])    Colon polyp, hyperplastic    Crohn's colitis ([place])    Diverticulosis    H/O: iron deficiency anemia    Hemorrhoids     Patient Active Problem List   Diagnosis Date Noted   Chronic anticoagulation 10/19/2017   Crohn's disease of colon with complication ([place]) 34/74/2595    Past Surgical History:  Procedure Laterality Date   FINGER SURGERY     Thumb ; right       Home Medications    Prior to Admission medications   Medication Sig Start Date End Date Taking? Authorizing Provider  sulfamethoxazole-trimethoprim (BACTRIM DS) 800-160 MG tablet Take 1 tablet by mouth 2 (two) times daily for 3 days. 11/24/20 11/27/20 Yes Eliezer Lofts, FNP  apixaban (ELIQUIS) 5 MG TABS tablet Take 5 mg by mouth 2 (two) times daily.    [provider]  cholecalciferol (VITAMIN D) 1000 UNITS tablet Take 1,000 Units by mouth daily.      [provider]  flecainide (TAMBOCOR) 100 MG tablet Take by mouth. 05/07/18   [provider]  inFLIXimab (REMICADE) 100 MG injection Inject 5 mg/kg into the vein every 8 (eight) weeks.      [provider]  metoprolol tartrate (LOPRESSOR) 25 MG tablet TAKE ONE TABLET (25 MG TOTAL) BY MOUTH 2 (TWO) TIMES DAILY. 12/09/15   [provider]  Multiple Vitamins-Minerals (MULTIVITAMIN WITH MINERALS) tablet Take 1 tablet by mouth daily.    [provider]  dabigatran (PRADAXA) 150 MG CAPS Take 150 mg by mouth every 12 (twelve) hours. Twice  daily   05/01/11  [provider]    Family History Family History  Problem Relation Age of Onset   Lung cancer Father    Heart disease Father    Heart disease Mother    Alzheimer's disease Mother    Colon cancer Neg Hx    Esophageal cancer Neg Hx    Rectal cancer Neg Hx    Stomach cancer Neg Hx     Social History Social History   Tobacco Use   Smoking status: Former    Types: Cigarettes   Smokeless tobacco: Never   Tobacco comments:    Quit in 2000  Vaping Use   Vaping Use: Never used  Substance Use Topics   Alcohol use: Yes    Comment: Rarely once a year.   Drug use: No     Allergies   Codeine   Review of Systems Review of Systems  Genitourinary:  Positive for dysuria and hematuria.  All other systems reviewed and are negative.   Physical Exam Triage Vital Signs ED Triage Vitals  Enc Vitals Group     BP 11/24/20 0942 (!) 148/93     Pulse Rate 11/24/20 0942 84     Resp 11/24/20 0942 16     Temp 11/24/20 0942 98.3 F (36.8 C)     Temp Source 11/24/20 0942 Oral     SpO2  11/24/20 0942 99 %     Weight --      Height --      Head Circumference --      Peak Flow --      Pain Score 11/24/20 0944 0     Pain Loc --      Pain Edu? --      Excl. in [place]? --    No data found.  Updated Vital Signs BP (!) 148/93 (BP Location: Left Arm)   Pulse 84   Temp 98.3 F (36.8 C) (Oral)   Resp 16   SpO2 99%      Physical Exam Vitals and nursing note reviewed.  Constitutional:      General: He is not in acute distress.    Appearance: Normal appearance. He is normal weight. He is not ill-appearing.  HENT:     Head: Normocephalic and atraumatic.     Mouth/Throat:     Mouth: Mucous membranes are moist.     Pharynx: Oropharynx is clear.  Eyes:     Extraocular Movements: Extraocular movements intact.     Conjunctiva/sclera: Conjunctivae normal.     Pupils: Pupils are equal, round, and reactive to light.  Cardiovascular:     Rate and Rhythm: Normal  rate and regular rhythm.     Pulses: Normal pulses.     Heart sounds: Normal heart sounds.  Pulmonary:     Effort: Pulmonary effort is normal.     Breath sounds: Normal breath sounds.     Comments: No adventitious breath sounds noted Abdominal:     Tenderness: There is no right CVA tenderness or left CVA tenderness.  Musculoskeletal:        General: Normal range of motion.     Cervical back: Normal range of motion and neck supple.  Skin:    General: Skin is warm and dry.  Neurological:     General: No focal deficit present.     Mental Status: He is alert and oriented to person, place, and time. Mental status is at baseline.  Psychiatric:        Mood and Affect: Mood normal.        Behavior: Behavior normal.        Thought Content: Thought content normal.     UC Treatments / Results  Labs (all labs ordered are listed, but only abnormal results are displayed) Labs Reviewed  POCT URINALYSIS DIP (MANUAL ENTRY) - Abnormal; Notable for the following components:      Result Value   Blood, UA moderate (*)    All other components within normal limits  URINE CULTURE    EKG   Radiology No results found.  Procedures Procedures (including critical care time)  Medications Ordered in UC Medications - No data to display  Initial Impression / Assessment and Plan / UC Course  I have reviewed the triage vital signs and the nursing notes.  Pertinent labs & imaging results that were available during my care of the patient were reviewed by me and considered in my medical decision making (see chart for details).     MDM: 1.  Acute cystitis with hematuria-UA revealed above, urine culture ordered, Rx'd Bactrim. Advised patient to take medication as directed with food to completion. Encouraged patient to increase daily water intake while taking this medication.  Advised patient we will follow-up with urine culture results once received.  Discharged home, hemodynamically stable.  Final  Clinical Impressions(s) / UC Diagnoses   Final diagnoses:  Acute cystitis with hematuria     Discharge Instructions      Advised patient to take medication as directed with food to completion. Encouraged patient to increase daily water intake while taking this medication.  Advised patient we will follow-up with urine culture results once received.     ED Prescriptions     Medication Sig Dispense Auth. Provider   sulfamethoxazole-trimethoprim (BACTRIM DS) 800-160 MG tablet Take 1 tablet by mouth 2 (two) times daily for 3 days. 6 tablet Eliezer Lofts, FNP      PDMP not reviewed this encounter.   Eliezer Lofts, [place] 11/24/20 1037

## 2020-11-25 LAB — URINE CULTURE
MICRO NUMBER:: 12493510
Result:: NO GROWTH
SPECIMEN QUALITY:: ADEQUATE

## 2021-01-07 ENCOUNTER — Encounter: Payer: Self-pay | Admitting: Internal Medicine

## 2021-04-20 ENCOUNTER — Ambulatory Visit: Payer: Medicare Other | Admitting: Physician Assistant

## 2021-04-27 ENCOUNTER — Ambulatory Visit (INDEPENDENT_AMBULATORY_CARE_PROVIDER_SITE_OTHER): Payer: Medicare Other | Admitting: Physician Assistant

## 2021-04-27 ENCOUNTER — Other Ambulatory Visit (INDEPENDENT_AMBULATORY_CARE_PROVIDER_SITE_OTHER): Payer: Medicare Other

## 2021-04-27 ENCOUNTER — Encounter: Payer: Self-pay | Admitting: Physician Assistant

## 2021-04-27 ENCOUNTER — Telehealth: Payer: Self-pay

## 2021-04-27 DIAGNOSIS — K50119 Crohn's disease of large intestine with unspecified complications: Secondary | ICD-10-CM

## 2021-04-27 DIAGNOSIS — Z7901 Long term (current) use of anticoagulants: Secondary | ICD-10-CM

## 2021-04-27 LAB — CBC WITH DIFFERENTIAL/PLATELET
Basophils Absolute: 0.1 10*3/uL (ref 0.0–0.1)
Basophils Relative: 0.8 % (ref 0.0–3.0)
Eosinophils Absolute: 0.1 10*3/uL (ref 0.0–0.7)
Eosinophils Relative: 1.4 % (ref 0.0–5.0)
HCT: 43 % (ref 39.0–52.0)
Hemoglobin: 14.8 g/dL (ref 13.0–17.0)
Lymphocytes Relative: 27.3 % (ref 12.0–46.0)
Lymphs Abs: 1.7 10*3/uL (ref 0.7–4.0)
MCHC: 34.3 g/dL (ref 30.0–36.0)
MCV: 93.1 fl (ref 78.0–100.0)
Monocytes Absolute: 0.5 10*3/uL (ref 0.1–1.0)
Monocytes Relative: 7.5 % (ref 3.0–12.0)
Neutro Abs: 4 10*3/uL (ref 1.4–7.7)
Neutrophils Relative %: 63 % (ref 43.0–77.0)
Platelets: 344 10*3/uL (ref 150.0–400.0)
RBC: 4.62 Mil/uL (ref 4.22–5.81)
RDW: 13 % (ref 11.5–15.5)
WBC: 6.4 10*3/uL (ref 4.0–10.5)

## 2021-04-27 LAB — COMPREHENSIVE METABOLIC PANEL
ALT: 15 U/L (ref 0–53)
AST: 15 U/L (ref 0–37)
Albumin: 4.4 g/dL (ref 3.5–5.2)
Alkaline Phosphatase: 73 U/L (ref 39–117)
BUN: 16 mg/dL (ref 6–23)
CO2: 27 mEq/L (ref 19–32)
Calcium: 9.6 mg/dL (ref 8.4–10.5)
Chloride: 105 mEq/L (ref 96–112)
Creatinine, Ser: 0.87 mg/dL (ref 0.40–1.50)
GFR: 88.47 mL/min (ref >60.00)
Glucose, Bld: 119 mg/dL — ABNORMAL HIGH (ref 70–99)
Potassium: 4 mEq/L (ref 3.5–5.1)
Sodium: 140 mEq/L (ref 135–145)
Total Bilirubin: 0.4 mg/dL (ref 0.2–1.2)
Total Protein: 6.8 g/dL (ref 6.0–8.3)

## 2021-04-27 MED ORDER — INFLIXIMAB 100 MG IV SOLR: 5.0000 mg/kg | INTRAVENOUS | 11 refills | Status: DC

## 2021-04-27 MED ORDER — NA SULFATE-K SULFATE-MG SULF 17.5-3.13-1.6 GM/177ML PO SOLN: 1.0000 | ORAL | 0 refills | Status: DC

## 2021-04-27 NOTE — Progress Notes (Signed)
? ?Chief Complaint: Follow-up indeterminate colitis, most likely Crohn's disease ? ?HPI: ?   [name] is a 71 year old male with a past medical history as listed below including A-fib on Eliquis and indeterminate colitis, most likely Crohn's, known to Dr. Henrene Pastor, who presents clinic today for follow-up. ?   10/07/2019 patient seen in clinic for follow-up by Dr. Henrene Pastor.  At that time was doing well on his Remicade 5 mg/kg IV every 8 weeks.  Last colonoscopy noted in October 2019 with recommendations to repeat in 3 years. ?   Today, the patient tells me he is doing very well as long as he stays on his Remicade.  He was actually due for an infusion today but his insurance told him he had to come be seen in our office before they would continue to pay for this treatment.  He has no problems with abdominal pain and has regular soft solid bowel movements. ?   Denies fever, chills, weight loss, change in bowel habits or blood in his stool. ? ?Past Medical History:  ?Diagnosis Date  ? Atrial fibrillation ([place])   ? Colon polyp, hyperplastic   ? Crohn's colitis ([place])   ? Diverticulosis   ? H/O: iron deficiency anemia   ? Hemorrhoids   ? ? ?Past Surgical History:  ?Procedure Laterality Date  ? FINGER SURGERY    ? Thumb ; right  ? ? ?Current Outpatient Medications  ?Medication Sig Dispense Refill  ? apixaban (ELIQUIS) 5 MG TABS tablet Take 5 mg by mouth 2 (two) times daily.    ? cholecalciferol (VITAMIN D) 1000 UNITS tablet Take 1,000 Units by mouth daily.      ? flecainide (TAMBOCOR) 100 MG tablet Take by mouth.    ? inFLIXimab (REMICADE) 100 MG injection Inject 5 mg/kg into the vein every 8 (eight) weeks.      ? metoprolol tartrate (LOPRESSOR) 25 MG tablet TAKE ONE TABLET (25 MG TOTAL) BY MOUTH 2 (TWO) TIMES DAILY.    ? Multiple Vitamins-Minerals (MULTIVITAMIN WITH MINERALS) tablet Take 1 tablet by mouth daily.    ? ?Current Facility-Administered Medications  ?Medication Dose Route Frequency Provider Last Rate Last Admin  ?  0.9 %  sodium chloride infusion  500 mL Intravenous Once Irene Shipper, MD      ? ? ?Allergies as of 04/27/2021 - Review Complete 04/27/2021  ?Allergen Reaction Noted  ? Codeine Itching 12/19/2010  ? ? ?Family History  ?Problem Relation Age of Onset  ? Lung cancer Father   ? Heart disease Father   ? Heart disease Mother   ? Alzheimer's disease Mother   ? Colon cancer Neg Hx   ? Esophageal cancer Neg Hx   ? Rectal cancer Neg Hx   ? Stomach cancer Neg Hx   ? ? ?Social History  ? ?Socioeconomic History  ? Marital status: Married  ?  Spouse name: Not on file  ? Number of children: Not on file  ? Years of education: Not on file  ? Highest education level: Not on file  ?Occupational History  ? Occupation: self employed  ?Tobacco Use  ? Smoking status: Former  ?  Types: Cigarettes  ? Smokeless tobacco: Never  ? Tobacco comments:  ?  Quit in 2000  ?Vaping Use  ? Vaping Use: Never used  ?Substance and Sexual Activity  ? Alcohol use: Yes  ?  Comment: Rarely once a year.  ? Drug use: No  ? Sexual activity: Not on file  ?Other  Topics Concern  ? Not on file  ?Social History Narrative  ? Daily caffeine  ? ?Social Determinants of Health  ? ?Financial Resource Strain: Not on file  ?Food Insecurity: Not on file  ?Transportation Needs: Not on file  ?Physical Activity: Not on file  ?Stress: Not on file  ?Social Connections: Not on file  ?Intimate Partner Violence: Not on file  ? ? ?Review of Systems:    ?Constitutional: No weight loss, fever or chills ?Skin: No rash ?Cardiovascular: No chest pain ?Respiratory: No SOB  ?Gastrointestinal: See HPI and otherwise negative ?Musculoskeletal: No new muscle or joint pain ? ? Physical Exam:  ?Vital signs: ?BP 126/80   Pulse 82   Ht 6' 2"  (1.88 m)   Wt 199 lb (90.3 kg)   BMI 25.55 kg/m?  ? ?Constitutional:   Pleasant Caucasian male appears to be in NAD, Well developed, Well nourished, alert and cooperative ?Respiratory: Respirations even and unlabored. Lungs clear to auscultation  bilaterally.   No wheezes, crackles, or rhonchi.  ?Cardiovascular: Normal S1, S2. No MRG. Regular rate and rhythm. No peripheral edema, cyanosis or pallor.  ?Gastrointestinal:  Soft, nondistended, nontender. No rebound or guarding. Normal bowel sounds. No appreciable masses or hepatomegaly. ?Rectal:  Not performed.  ?Psychiatric: Oriented to person, place and time. Demonstrates good judgement and reason without abnormal affect or behaviors. ? ?No recent labs or imaging. ? ?Assessment: ?1.  Indeterminate colitis: Most Likely Crohns, Patient remains in deep remission clinically and histologically on Remicade therapy, last colonoscopy October 2019 with recommendations to repeat in 3 years ?2.  A-fib: On Eliquis ? ?Plan: ?1.  Refilled patient's Remicade therapy 5 mg/kg IV every 8 weeks ?2.  Patient scheduled for a surveillance colonoscopy in the [place] with Dr. Henrene Pastor.  Did provide the patient a detailed list of risks for the procedure and he agrees to proceed. Patient is appropriate for endoscopic procedure(s) in the ambulatory ([place]) setting.  ?3.  Recheck CBC and CMP today.  Discussed that we usually like to check these every 6 months or so to ensure that he does not have any side effects from Remicade.  Patient tells me he typically has these drawn at the infusion center but he does not think he has had it done for at least 6 months. ?4.  Patient advised to hold his Eliquis for 2 days prior to time of procedure.  We will communicate with his prescribing physician to ensure this is acceptable for him. ?5.  Patient to follow in clinic with [place] in a year or sooner if necessary. ? ?Ellouise Newer, PA-C ?[facility] ?04/27/2021, 10:36 AM ? ? ?

## 2021-04-27 NOTE — Telephone Encounter (Signed)
?  Jerilee Hoh ?06-04-1950 ?[id number] ? ?Dear Dr Mauricio Po: ? ?We have scheduled the above named patient for a colonoscopy procedure. Our records show that he is on anticoagulation therapy. ? ?Please advise as to whether the patient may come off their therapy of Eliquis 2 days prior to their procedure which is scheduled for 06-02-21. ? ?Please route your response to Leanne Chang, CMA or fax response to [phone]. ? ?Sincerely, ? ? ? ?[facility] ?

## 2021-04-27 NOTE — Telephone Encounter (Signed)
Patient receives his infusions at: ? ?[facility]   ?[address]   ?[place], [place] [address]   ?[phone]. ? ?Written orders for Remicade 35m/kg have been faxed to the above infusion center at [phone]. ? ?

## 2021-04-27 NOTE — Patient Instructions (Addendum)
If you are age 69 or older, your body mass index should be between 23-30. Your Body mass index is 25.55 kg/m?Marland Kitchen If this is out of the aforementioned range listed, please consider follow up with your Primary Care Provider. ?________________________________________________________ ? ?The [facility] providers would like to encourage you to use [facility] to communicate with providers for non-urgent requests or questions.  Due to long hold times on the telephone, sending your provider a message by [facility] may be a faster and more efficient way to get a response.  Please allow 48 business hours for a response.  Please remember that this is for non-urgent requests.  ?_______________________________________________________ ? ?Your provider has requested that you go to the basement level for lab work before leaving today. Press "B" on the elevator. The lab is located at the first door on the left as you exit the elevator. ? ?You have been scheduled for a colonoscopy. Please follow written instructions given to you at your visit today.  ?Please pick up your prep supplies at the pharmacy within the next 1-3 days. ?If you use inhalers (even only as needed), please bring them with you on the day of your procedure. ? ?Due to recent changes in healthcare laws, you may see the results of your imaging and laboratory studies on MyChart before your provider has had a chance to review them.  We understand that in some cases there may be results that are confusing or concerning to you. Not all laboratory results come back in the same time frame and the provider may be waiting for multiple results in order to interpret others.  Please give [place] 48 hours in order for your provider to thoroughly review all the results before contacting the office for clarification of your results.  ? ?Thank you for entrusting me with your care and choosing [facility]. ? ?Ellouise Newer, PA-C ?

## 2021-04-27 NOTE — Progress Notes (Signed)
Assessment and plan reviewed 

## 2021-04-27 NOTE — Telephone Encounter (Signed)
-----   Message from [place] sent at 04/27/2021 10:56 AM EDT ----- ?Regarding: Remicade ?Please refill patient's Remicade 32m/kg IV every 8 weeks per  JAnderson Malta ? ?Thank you ? ?

## 2021-04-29 NOTE — Telephone Encounter (Signed)
Patient advised that per fax received from [facility], NP at [facility] he has been given clearance to hold Eliquis 2 days prior to colonoscopy scheduled for 06-02-21.  Patient advised to take last dose of Eliquis on 05-30-21, and he will be advised when to restart Eliquis by Dr Henrene Pastor after the procedure.  Patient agreed to plan and verbalized understanding.  No further questions.  ? ?

## 2021-06-02 ENCOUNTER — Encounter: Payer: Self-pay | Admitting: Internal Medicine

## 2021-06-02 ENCOUNTER — Ambulatory Visit (AMBULATORY_SURGERY_CENTER): Payer: Medicare Other | Admitting: Internal Medicine

## 2021-06-02 VITALS — BP 120/81 | HR 69 | Temp 97.7°F | Resp 21 | Ht 74.0 in | Wt 199.0 lb

## 2021-06-02 DIAGNOSIS — K50119 Crohn's disease of large intestine with unspecified complications: Secondary | ICD-10-CM

## 2021-06-02 DIAGNOSIS — K523 Indeterminate colitis: Secondary | ICD-10-CM

## 2021-06-02 DIAGNOSIS — D124 Benign neoplasm of descending colon: Secondary | ICD-10-CM

## 2021-06-02 DIAGNOSIS — D125 Benign neoplasm of sigmoid colon: Secondary | ICD-10-CM | POA: Diagnosis not present

## 2021-06-02 MED ORDER — SODIUM CHLORIDE 0.9 % IV SOLN: 500.0000 mL | Freq: Once | INTRAVENOUS | Status: DC

## 2021-06-02 NOTE — Progress Notes (Signed)
PT taken to PACU. Monitors in place. VSS. Report given to RN. 

## 2021-06-02 NOTE — Progress Notes (Signed)
VS completed by DT.  Pt's states no medical or surgical changes since previsit or office visit.  

## 2021-06-02 NOTE — Patient Instructions (Signed)
Thank you for coming in to see [place] today! ?Resume previous diet today. ?Resume Eliquis and other medications today. ?Please see Dr Henrene Pastor in one year for a follow up. ?Recommend surveillance colonoscopy in 3 years. ? ? ?YOU HAD AN ENDOSCOPIC PROCEDURE TODAY AT [facility]:   Refer to the procedure report that was given to you for any specific questions about what was found during the examination.  If the procedure report does not answer your questions, please call your gastroenterologist to clarify.  If you requested that your care partner not be given the details of your procedure findings, then the procedure report has been included in a sealed envelope for you to review at your convenience later. ? ?YOU SHOULD EXPECT: Some feelings of bloating in the abdomen. Passage of more gas than usual.  Walking can help get rid of the air that was put into your GI tract during the procedure and reduce the bloating. If you had a lower endoscopy (such as a colonoscopy or flexible sigmoidoscopy) you may notice spotting of blood in your stool or on the toilet paper. If you underwent a bowel prep for your procedure, you may not have a normal bowel movement for a few days. ? ?Please Note:  You might notice some irritation and congestion in your nose or some drainage.  This is from the oxygen used during your procedure.  There is no need for concern and it should clear up in a day or so. ? ?SYMPTOMS TO REPORT IMMEDIATELY: ? ?Following lower endoscopy (colonoscopy or flexible sigmoidoscopy): ? Excessive amounts of blood in the stool ? Significant tenderness or worsening of abdominal pains ? Swelling of the abdomen that is new, acute ? Fever of 100?F or higher ? ? ? ?For urgent or emergent issues, a gastroenterologist can be reached at any hour by calling [phone]. ?Do not use MyChart messaging for urgent concerns.  ? ? ?DIET:  We do recommend a small meal at first, but then you may proceed to your regular diet.   Drink plenty of fluids but you should avoid alcoholic beverages for 24 hours. ? ?ACTIVITY:  You should plan to take it easy for the rest of today and you should NOT DRIVE or use heavy machinery until tomorrow (because of the sedation medicines used during the test).   ? ?FOLLOW UP: ?Our staff will call the number listed on your records 48-72 hours following your procedure to check on you and address any questions or concerns that you may have regarding the information given to you following your procedure. If we do not reach you, we will leave a message.  We will attempt to reach you two times.  During this call, we will ask if you have developed any symptoms of COVID 19. If you develop any symptoms (ie: fever, flu-like symptoms, shortness of breath, cough etc.) before then, please call [phone].  If you test positive for Covid 19 in the 2 weeks post procedure, please call and report this information to [place].   ? ?If any biopsies were taken you will be contacted by phone or by letter within the next 1-3 weeks.  Please call us at [phone] if you have not heard about the biopsies in 3 weeks.  ? ? ?SIGNATURES/CONFIDENTIALITY: ?You and/or your care partner have signed paperwork which will be entered into your electronic medical record.  These signatures attest to the fact that that the information above on your After Visit Summary has been  reviewed and is understood.  Full responsibility of the confidentiality of this discharge information lies with you and/or your care-partner.  ?

## 2021-06-02 NOTE — Progress Notes (Signed)
Called to room to assist during endoscopic procedure.  Patient ID and intended procedure confirmed with present staff. Received instructions for my participation in the procedure from the performing physician.  

## 2021-06-02 NOTE — Progress Notes (Signed)
Chief Complaint: Follow-up indeterminate colitis, most likely Crohn's disease ?  ?HPI: ?   Donald Duncan is a 71 year old male with a past medical history as listed below including A-fib on Eliquis and indeterminate colitis, most likely Crohn's, known to Dr. Henrene Pastor, who presents clinic today for follow-up. ?   10/07/2019 patient seen in clinic for follow-up by Dr. Henrene Pastor.  At that time was doing well on his Remicade 5 mg/kg IV every 8 weeks.  Last colonoscopy noted in October 2019 with recommendations to repeat in 3 years. ?   Today, the patient tells me he is doing very well as long as he stays on his Remicade.  He was actually due for an infusion today but his insurance told him he had to come be seen in our office before they would continue to pay for this treatment.  He has no problems with abdominal pain and has regular soft solid bowel movements. ?   Denies fever, chills, weight loss, change in bowel habits or blood in his stool. ?  ?    ?Past Medical History:  ?Diagnosis Date  ? Atrial fibrillation ([place])    ? Colon polyp, hyperplastic    ? Crohn's colitis ([place])    ? Diverticulosis    ? H/O: iron deficiency anemia    ? Hemorrhoids    ?  ?  ?     ?Past Surgical History:  ?Procedure Laterality Date  ? FINGER SURGERY      ?  Thumb ; right  ?  ?  ?      ?Current Outpatient Medications  ?Medication Sig Dispense Refill  ? apixaban (ELIQUIS) 5 MG TABS tablet Take 5 mg by mouth 2 (two) times daily.      ? cholecalciferol (VITAMIN D) 1000 UNITS tablet Take 1,000 Units by mouth daily.        ? flecainide (TAMBOCOR) 100 MG tablet Take by mouth.      ? inFLIXimab (REMICADE) 100 MG injection Inject 5 mg/kg into the vein every 8 (eight) weeks.        ? metoprolol tartrate (LOPRESSOR) 25 MG tablet TAKE ONE TABLET (25 MG TOTAL) BY MOUTH 2 (TWO) TIMES DAILY.      ? Multiple Vitamins-Minerals (MULTIVITAMIN WITH MINERALS) tablet Take 1 tablet by mouth daily.      ?  ?         ?Current Facility-Administered Medications  ?Medication Dose  Route Frequency Provider Last Rate Last Admin  ? 0.9 %  sodium chloride infusion  500 mL Intravenous Once Irene Shipper, MD      ?  ?  ?     ?Allergies as of 04/27/2021 - Review Complete 04/27/2021  ?Allergen Reaction Noted  ? Codeine Itching 12/19/2010  ?  ?  ?     ?Family History  ?Problem Relation Age of Onset  ? Lung cancer Father    ? Heart disease Father    ? Heart disease Mother    ? Alzheimer's disease Mother    ? Colon cancer Neg Hx    ? Esophageal cancer Neg Hx    ? Rectal cancer Neg Hx    ? Stomach cancer Neg Hx    ?  ?  ?Social History  ?  ?     ?Socioeconomic History  ? Marital status: Married  ?    Spouse name: Not on file  ? Number of children: Not on file  ? Years of education: Not on file  ?  Highest education level: Not on file  ?Occupational History  ? Occupation: self employed  ?Tobacco Use  ? Smoking status: Former  ?    Types: Cigarettes  ? Smokeless tobacco: Never  ? Tobacco comments:  ?    Quit in 2000  ?Vaping Use  ? Vaping Use: Never used  ?Substance and Sexual Activity  ? Alcohol use: Yes  ?    Comment: Rarely once a year.  ? Drug use: No  ? Sexual activity: Not on file  ?Other Topics Concern  ? Not on file  ?Social History Narrative  ?  Daily caffeine  ?  ?Social Determinants of Health  ?  ?Financial Resource Strain: Not on file  ?Food Insecurity: Not on file  ?Transportation Needs: Not on file  ?Physical Activity: Not on file  ?Stress: Not on file  ?Social Connections: Not on file  ?Intimate Partner Violence: Not on file  ?  ?  ?Review of Systems:    ?Constitutional: No weight loss, fever or chills ?Skin: No rash ?Cardiovascular: No chest pain ?Respiratory: No SOB  ?Gastrointestinal: See HPI and otherwise negative ?Musculoskeletal: No new muscle or joint pain ?  ? Physical Exam:  ?Vital signs: ?BP 126/80   Pulse 82   Ht 6' 2"  (1.88 m)   Wt 199 lb (90.3 kg)   BMI 25.55 kg/m?  ?  ?Constitutional:   Pleasant Caucasian male appears to be in NAD, Well developed, Well nourished, alert and  cooperative ?Respiratory: Respirations even and unlabored. Lungs clear to auscultation bilaterally.   No wheezes, crackles, or rhonchi.  ?Cardiovascular: Normal S1, S2. No MRG. Regular rate and rhythm. No peripheral edema, cyanosis or pallor.  ?Gastrointestinal:  Soft, nondistended, nontender. No rebound or guarding. Normal bowel sounds. No appreciable masses or hepatomegaly. ?Rectal:  Not performed.  ?Psychiatric: Oriented to person, place and time. Demonstrates good judgement and reason without abnormal affect or behaviors. ?  ?No recent labs or imaging. ?  ?Assessment: ?1.  Indeterminate colitis: Most Likely Crohns, Patient remains in deep remission clinically and histologically on Remicade therapy, last colonoscopy October 2019 with recommendations to repeat in 3 years ?2.  A-fib: On Eliquis ?  ?Plan: ?1.  Refilled patient's Remicade therapy 5 mg/kg IV every 8 weeks ?2.  Patient scheduled for a surveillance colonoscopy in the [facility] with Dr. Henrene Pastor.  Did provide the patient a detailed list of risks for the procedure and he agrees to proceed. Patient is appropriate for endoscopic procedure(s) in the ambulatory ([facility]) setting.  ?3.  Recheck CBC and CMP today.  Discussed that we usually like to check these every 6 months or so to ensure that he does not have any side effects from Remicade.  Patient tells me he typically has these drawn at the infusion center but he does not think he has had it done for at least 6 months. ?4.  Patient advised to hold his Eliquis for 2 days prior to time of procedure.  We will communicate with his prescribing physician to ensure this is acceptable for him. ?5.  Patient to follow in clinic with [place] in a year or sooner if necessary. ?  ?

## 2021-06-02 NOTE — Op Note (Signed)
[facility] ?Patient Name: Donald Duncan ?Procedure Date: 06/02/2021 8:43 AM ?MRN: [id number] ?Endoscopist: Docia Chuck. Henrene Pastor , MD ?Age: 71 ?Referring MD:  ?Date of Birth: January 09, 1951 ?Gender: Male ?Account #: [id number] ?Procedure:                Colonoscopy with biopsies; with cold snare  ?                          polypectomy x 2 ?Indications:              High risk colon cancer surveillance: Ulcerative  ?                          pancolitis of 8 (or more) years duration. Multiple  ?                          previous examinations. Last examination October 2019 ?Medicines:                Monitored Anesthesia Care ?Procedure:                Pre-Anesthesia Assessment: ?                          - Prior to the procedure, a History and Physical  ?                          was performed, and patient medications and  ?                          allergies were reviewed. The patient's tolerance of  ?                          previous anesthesia was also reviewed. The risks  ?                          and benefits of the procedure and the sedation  ?                          options and risks were discussed with the patient.  ?                          All questions were answered, and informed consent  ?                          was obtained. Prior Anticoagulants: The patient has  ?                          taken Eliquis (apixaban), last dose was 4 days  ?                          prior to procedure. ASA Grade Assessment: III - A  ?                          patient with severe systemic disease. After  ?  reviewing the risks and benefits, the patient was  ?                          deemed in satisfactory condition to undergo the  ?                          procedure. ?                          After obtaining informed consent, the colonoscope  ?                          was passed under direct vision. Throughout the  ?                          procedure, the patient's blood pressure, pulse, and  ?                           oxygen saturations were monitored continuously. The  ?                          Olympus CF-[id number] (#[id number]) Colonoscope was  ?                          introduced through the anus and advanced to the the  ?                          cecum, identified by appendiceal orifice and  ?                          ileocecal valve. The ileocecal valve, appendiceal  ?                          orifice, and rectum were photographed. The quality  ?                          of the bowel preparation was excellent. The  ?                          colonoscopy was performed without difficulty. The  ?                          patient tolerated the procedure well. The bowel  ?                          preparation used was SUPREP via split dose  ?                          instruction. ?Scope In: 9:14:36 AM ?Scope Out: 9:31:41 AM ?Scope Withdrawal Time: 0 hours 15 minutes 34 seconds  ?Total Procedure Duration: 0 hours 17 minutes 5 seconds  ?Findings:                 Two polyps were found in the sigmoid colon and  ?  descending colon. There were most consistent with  ?                          spontaneous adenomatous polyps. The polyps were 2  ?                          to 3 mm in size. These polyps were removed with a  ?                          cold snare. Resection and retrieval were complete.  ?                          In addition, multiple classic pseudopolyps in the  ?                          left colon (see images). As well there are  ?                          scattered diverticula in the left and right colon. ?                          The entire examined colon appeared otherwise normal  ?                          on direct and retroflexion views. There was no  ?                          active inflammation. Surveillance biopsies were  ?                          taken with a cold forceps from resection of the  ?                          colon for histology (4 jars). ?Complications:             No immediate complications. Estimated blood loss:  ?                          None. ?Estimated Blood Loss:     Estimated blood loss: none. ?Impression:               - Two 2 to 3 mm polyps in the sigmoid colon and in  ?                          the descending colon, removed with a cold snare.  ?                          Resected and retrieved. ?                          ??? Pseudopolyposis ?                          ??? Diverticulosis ?                          -  The entire examined colon is otherwise normal on  ?                          direct and retroflexion views. Status post  ?                          surveillance biopsies. ?Recommendation:           - Repeat colonoscopy in 3 years for surveillance. ?                          - Resume Eliquis (apixaban) today at prior dose. ?                          - Patient has a contact number available for  ?                          emergencies. The signs and symptoms of potential  ?                          delayed complications were discussed with the  ?                          patient. Return to normal activities tomorrow.  ?                          Written discharge instructions were provided to the  ?                          patient. ?                          - Resume previous diet. ?                          - Continue present medications. ?                          - Await pathology results. ?                          ??? Office follow-up 1 year ?Docia Chuck. Henrene Pastor, MD ?06/02/2021 9:42:36 AM ?This report has been signed electronically. ?

## 2021-06-03 ENCOUNTER — Telehealth: Payer: Self-pay | Admitting: Internal Medicine

## 2021-06-03 NOTE — Telephone Encounter (Addendum)
Inbound call from patient reports had colon 4/20 and now in A-VIB. Need to know if he should go to ED first ?

## 2021-06-03 NOTE — Telephone Encounter (Signed)
Returned pts call.  Pt states that he is in Afib and has called cardiologists office and is to be seen on Monday.  Advised patient that procedure documentation states he was in SR during procedure and recovery. He is worried about waiting the weekend.  BP is 122/61 and HR is 75.  Wants to know my opinion if this is ok.  Told him that I would rely on the cardiologists office to make that decision.  He said that he just spoke with a scheduler.  I suggested he call back and try to speak with a nurse.   ?

## 2021-06-03 NOTE — Telephone Encounter (Signed)
Agree.  ?Recommendations per cardiology or ER for any associated symptoms. He should have restarted his [place] yesterday. ?Thanks for update, Lanelle Bal.  ?JP ?

## 2021-06-06 ENCOUNTER — Telehealth: Payer: Self-pay

## 2021-06-06 ENCOUNTER — Encounter: Payer: Self-pay | Admitting: Internal Medicine

## 2021-06-06 ENCOUNTER — Telehealth: Payer: Self-pay | Admitting: Internal Medicine

## 2021-06-06 NOTE — Telephone Encounter (Signed)
Attempted f/u call, no answer. Left VM. ?

## 2021-06-06 NOTE — Telephone Encounter (Signed)
Attempted 2nd f/u call. No answer, left VM. ?

## 2021-06-06 NOTE — Telephone Encounter (Signed)
Patient called seeking advise. Per patient, had procedure done 06/02/21. Patient states he's in AFIB wanted recommendations for a cardiologist. Patient states he went to [facility] and did not like them. Please advise.  ?

## 2021-06-06 NOTE — Telephone Encounter (Signed)
Multiple excellent options including Drs.Sarina Ill, [place], [place], [place], etc.  ?[facility] is excellent  ?

## 2021-06-06 NOTE — Telephone Encounter (Signed)
Pt called and states he has been in a fib since procedure. He saw cardiology with [facility] this am and they are supposed to be scheduling a cardioversion. Pt states he is going to have this done but would like to know what cardiologist Dr. Henrene Pastor recommends. He is not very happy with [facility] and wants to switch after the cardioversion. Please advise. ?

## 2021-06-06 NOTE — Telephone Encounter (Signed)
Pt aware, he wanted the recommendations sent to him via mychart. Message sent via mychart as requested. ?

## 2021-06-21 ENCOUNTER — Other Ambulatory Visit: Payer: Self-pay

## 2021-06-21 ENCOUNTER — Telehealth: Payer: Self-pay | Admitting: Internal Medicine

## 2021-06-21 DIAGNOSIS — I4891 Unspecified atrial fibrillation: Secondary | ICD-10-CM

## 2021-06-21 NOTE — Telephone Encounter (Signed)
Yes.  Please refer him to [facility] ASAP.  Thanks ?

## 2021-06-21 NOTE — Telephone Encounter (Signed)
Referral entered in epic for [facility] cardiology. Pt knows he should be contacted by their office to set up an appt.  ?

## 2021-06-21 NOTE — Telephone Encounter (Signed)
Pt states he has not been happy with cardiology at [facility]. He wants to know if Dr. Henrene Pastor will refer him to [facility] cards. He does not have a PCP. He has received a letter from [facility] that they will provide him care for 30 days and then no longer be his cardiologist, he got the letter about a week ago. Please advise if ok to refer pt. He had A fib after recent procedure. ?

## 2021-06-21 NOTE — Telephone Encounter (Signed)
Patient called requesting a phone call regarding a referral letter from Dr. Henrene Pastor  Thank you. ?

## 2021-06-29 ENCOUNTER — Encounter: Payer: Self-pay | Admitting: Cardiovascular Disease

## 2021-06-29 ENCOUNTER — Ambulatory Visit (INDEPENDENT_AMBULATORY_CARE_PROVIDER_SITE_OTHER): Payer: Medicare Other | Admitting: Cardiovascular Disease

## 2021-06-29 VITALS — BP 122/79 | HR 63 | Ht 74.0 in | Wt 192.4 lb

## 2021-06-29 DIAGNOSIS — I480 Paroxysmal atrial fibrillation: Secondary | ICD-10-CM

## 2021-06-29 DIAGNOSIS — E785 Hyperlipidemia, unspecified: Secondary | ICD-10-CM

## 2021-06-29 DIAGNOSIS — Z7901 Long term (current) use of anticoagulants: Secondary | ICD-10-CM

## 2021-06-29 DIAGNOSIS — I341 Nonrheumatic mitral (valve) prolapse: Secondary | ICD-10-CM

## 2021-06-29 DIAGNOSIS — K50119 Crohn's disease of large intestine with unspecified complications: Secondary | ICD-10-CM

## 2021-06-29 DIAGNOSIS — R0683 Snoring: Secondary | ICD-10-CM | POA: Diagnosis not present

## 2021-06-29 DIAGNOSIS — I340 Nonrheumatic mitral (valve) insufficiency: Secondary | ICD-10-CM

## 2021-06-29 DIAGNOSIS — I10 Essential (primary) hypertension: Secondary | ICD-10-CM | POA: Diagnosis not present

## 2021-06-29 DIAGNOSIS — I4891 Unspecified atrial fibrillation: Secondary | ICD-10-CM

## 2021-06-29 NOTE — Progress Notes (Signed)
Cardiology Office Note    Date:  07/09/2021   ID:  Donald [place], Donald Duncan [birth year], MRN [id number]  PCP:  Patient, No Pcp Per (Inactive)  Cardiologist:  Shelva Majestic, MD   New cardiology evaluation to establish care   History of Present Illness:  Donald Duncan is a 71 y.o. male who is a former patient of Dr. Arrie Aran who had recently retired from [employer] in December 2022.  [place] has a history of initial episode of atrial fibrillation in 2000 with subsequent recurrent atrial fibrillation.  2012 and possibly 2017 treated by Dr. Mauricio Po.  He had been maintaining sinus rhythm.  He has a history of colitis, possibly to be Crohn's disease followed by Dr. Scarlette Shorts and has been treated with Remicade.  Recently, he was scheduled to undergo colonoscopy on June 02, 2021 and stopped taking Eliquis on May 29, 2021 in preparation.  Eliquis was restarted following the procedure and later that day the patient felt weak noted shortness of breath and was back in atrial fibrillation.  Of note, the patient had previously only been taking Eliquis once a day in addition to flecainide and metoprolol once a day rather than the scheduled twice daily regimen.  He was seen by Caprice Renshaw, NP on June 07, 2021 and was scheduled to undergo cardioversion on June 08, 2021 with Dr. Jac Canavan.  He has a reported history of mitral valve prolapse with mild mitral insufficiency  Presently, the patient admits to a normal rhythm.  He has desired to transfer care to [facility] health since Dr. Fabienne Bruns had retired and he sees Dr. Henrene Pastor.  In presents for new patient evaluation with me.  He denies any recent chest pain.  He believes he sleeps well however he admits that he snores when on Donald back and therefore typically sleeps on Donald side.  He goes to bed between 930 and 10 PM and wakes up at 5 AM he tries to average greater than 7 hours of sleep per night.  He admits to nocturia x1.  At times he admits to nocturnal palpitations.  He  presents for evaluation.  Past Medical History:  Diagnosis Date   Atrial fibrillation (HCC)    Colon polyp, hyperplastic    Crohn's colitis ([place])    Diverticulosis    H/O: iron deficiency anemia    Hemorrhoids     Past Surgical History:  Procedure Laterality Date   COLONOSCOPY     FINGER SURGERY     Thumb ; right    Current Medications: Outpatient Medications Prior to Visit  Medication Sig Dispense Refill   apixaban (ELIQUIS) 5 MG TABS tablet Take 5 mg by mouth 2 (two) times daily.     cholecalciferol (VITAMIN D) 1000 UNITS tablet Take 1,000 Units by mouth daily.       flecainide (TAMBOCOR) 100 MG tablet Take by mouth.     inFLIXimab (REMICADE) 100 MG injection Inject 500 mg into the vein every 8 (eight) weeks. [facility]   [address]   [address]   [place], [place] [address]   [phone] 1 each 11   metoprolol tartrate (LOPRESSOR) 25 MG tablet TAKE ONE TABLET (25 MG TOTAL) BY MOUTH 2 (TWO) TIMES DAILY.     Multiple Vitamins-Minerals (MULTIVITAMIN WITH MINERALS) tablet Take 1 tablet by mouth daily.     Facility-Administered Medications Prior to Visit  Medication Dose Route Frequency Provider Last Rate Last Admin   0.9 %  sodium chloride infusion  500 mL Intravenous Once Irene Shipper, MD         Allergies:   Codeine   Social History   Socioeconomic History   Marital status: Married    Spouse name: Not on file   Number of children: Not on file   Years of education: Not on file   Highest education level: Not on file  Occupational History   Occupation: self employed  Tobacco Use   Smoking status: Former    Types: Cigarettes   Smokeless tobacco: Never   Tobacco comments:    Quit in 2000  Vaping Use   Vaping Use: Never used  Substance and Sexual Activity   Alcohol use: Yes    Comment: Rarely once a year.   Drug use: No   Sexual activity: Not on file  Other Topics Concern   Not on file  Social History Narrative    Daily caffeine   Social Determinants of Health   Financial Resource Strain: Not on file  Food Insecurity: Not on file  Transportation Needs: Not on file  Physical Activity: Not on file  Stress: Not on file  Social Connections: Not on file    Social history is notable that he was born in [place].  He is married for 25 years and has 1 child.  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  There is remote tobacco history of smoking for 20 years but he quit smoking in 2001.  He drinks occasional beer.  He tries to exercise 5 days/week.  Family History:  The patient's family history includes Alzheimer's disease in [place]; Heart disease in [place] and Duncan; Lung cancer in [place].  Donald Duncan has Alzheimer's disease and is 40.  Duncan has lung cancer and was a smoker.  He has a half brother and 2 sisters.  He has a daughter age 12.  ROS General: Negative; No fevers, chills, or night sweats;  HEENT: Negative; No changes in vision or hearing, sinus congestion, difficulty swallowing Pulmonary: Negative; No cough, wheezing, shortness of breath, hemoptysis Cardiovascular: See HPI GI: Colitis, possible Crohn's on Remicade followed by Dr. Scarlette Shorts GU: Negative; No dysuria, hematuria, or difficulty voiding Musculoskeletal: Negative; no myalgias, joint pain, or weakness Hematologic/Oncology: Negative; no easy bruising, bleeding Endocrine: Negative; no heat/cold intolerance; no diabetes Neuro: Negative; no changes in balance, headaches Skin: Negative; No rashes or skin lesions Psychiatric: Negative; No behavioral problems, depression Sleep: Positive for snoring when sleeping supine.  He typically sleeps for approximately 7 hours.  Occasional nocturnal palpitations.  Nocturia x1.  An Epworth Sleepiness Scale score was calculated in the office today and this endorsed at 4.  No hypersomnolence, bruxism, restless legs, hypnogognic hallucinations, no cataplexy Other comprehensive 14 point  system review is negative.   PHYSICAL EXAM:   VS:  BP 122/79   Pulse 63   Ht _0  (1.88 m)   Wt 192 lb 6.4 oz (87.3 kg)   BMI 24.70 kg/m     Repeat blood pressure by me was 122/76.  Wt Readings from Last 3 Encounters:  06/29/21 192 lb 6.4 oz (87.3 kg)  06/02/21 199 lb (90.3 kg)  04/27/21 199 lb (90.3 kg)    General: Alert, oriented, no distress.  Skin: normal turgor, no rashes, warm and dry HEENT: Normocephalic, atraumatic. Pupils equal round and reactive to light; sclera anicteric; extraocular muscles intact;  Nose without nasal septal hypertrophy Mouth/Parynx benign; Mallinpatti scale 3 Neck: No JVD, no carotid bruits;  normal carotid upstroke Lungs: clear to ausculatation and percussion; no wheezing or rales Chest wall: without tenderness to palpitation Heart: PMI not displaced, RRR, s1 s2 normal, 1/6 systolic murmur at apex,  no diastolic murmur, no rubs, gallops, thrills, or heaves Abdomen: soft, nontender; no hepatosplenomehaly, BS+; abdominal aorta nontender and not dilated by palpation. Back: no CVA tenderness Pulses 2+ Musculoskeletal: full range of motion, normal strength, no joint deformities Extremities: no clubbing cyanosis or edema, Homan's sign negative  Neurologic: grossly nonfocal; Cranial nerves grossly wnl Psychologic: Normal mood and affect   Studies/Labs Reviewed:   ECG (independently read by me): NSR at 63, biatrial enlargement, QTc 440 msec; PR 176 msec  Recent Labs:    Latest Ref Rng & Units 04/27/2021   11:04 AM 05/25/2009    8:49 AM  BMP  Glucose 70 - 99 mg/dL 119   85    BUN 6 - 23 mg/dL 16   13    Creatinine 0.40 - 1.50 mg/dL 0.87   0.8    Sodium 135 - 145 mEq/L 140   142    Potassium 3.5 - 5.1 mEq/L 4.0   4.2    Chloride 96 - 112 mEq/L 105   107    CO2 19 - 32 mEq/L 27   29    Calcium 8.4 - 10.5 mg/dL 9.6   9.1          Latest Ref Rng & Units 04/27/2021   11:04 AM 05/25/2009    8:49 AM  Hepatic Function  Total Protein 6.0 - 8.3  g/dL 6.8   7.1    Albumin 3.5 - 5.2 g/dL 4.4   4.1    AST 0 - 37 U/L 15   18    ALT 0 - 53 U/L 15   22    Alk Phosphatase 39 - 117 U/L 73   71    Total Bilirubin 0.2 - 1.2 mg/dL 0.4   0.8    Bilirubin, Direct 0.0 - 0.3 mg/dL  0.1         Latest Ref Rng & Units 04/27/2021   11:04 AM 05/25/2009    8:49 AM  CBC  WBC 4.0 - 10.5 K/uL 6.4   5.2    Hemoglobin 13.0 - 17.0 g/dL 14.8   14.5    Hematocrit 39.0 - 52.0 % 43.0   42.0    Platelets 150.0 - 400.0 K/uL 344.0   313.0     Lab Results  Component Value Date   MCV 93.1 04/27/2021   MCV 91.1 05/25/2009   No results found for: TSH No results found for: HGBA1C   BNP No results found for: BNP  ProBNP No results found for: PROBNP   Lipid Panel  No results found for: CHOL, TRIG, HDL, CHOLHDL, VLDL, LDLCALC, LDLDIRECT, LABVLDL   RADIOLOGY: No results found.   Additional studies/ records that were reviewed today include:  I reviewed the records of Dr. Scarlette Shorts as well as [facility].   ASSESSMENT:    1. Paroxysmal atrial fibrillation (HCC)   2. Chronic anticoagulation   3. Essential hypertension   4. Snoring   5. Mitral valve prolapse   6. Nonrheumatic mitral valve regurgitation   7. Crohn's disease of colon with complication  [facility])      PLAN:  Mr. Tymere Depuy is a 71 year old gentleman who has a history of hypertension, reported mitral valve prolapse with mild MR as well as paroxysmal atrial fibrillation and has undergone prior cardioversions in  2000, 2012 approximately 2017.  He had been on Eliquis in addition to flecainide and metoprolol tartrate.  However recently, the patient was only taking flecainide and metoprolol once a day.  He had held Donald Eliquis to undergo colonoscopy which was done on June 02, 2020 by Dr. Henrene Pastor and subsequently developed episodes of lightheadedness and shortness of breath, was evaluated at [facility] cardiology and was back in atrial fibrillation.  He was advised to take Donald  medicines twice a day and was scheduled for cardioversion.  Presently, he is in sinus rhythm at 63 bpm with suggestion of biatrial enlargement.  QTc interval is normal.  I am recommending he undergo a 2D echo Doppler study for evaluation of both systolic and diastolic function, chamber dimensions and valvular architecture.  He denies any chest pain or shortness of breath.  He has a remote tobacco history smoked for 20 years, he fortunately quit tobacco in 2001.  Upon questioning he admits to snoring particularly when he lies on Donald back raising concern for obstructive sleep apnea.  He believes he sleeps well on Donald side has been trying to sleep for at least 7 hours.  I discussed with him today at length potential sleep apnea and adverse consequences particularly with increased risk for recurrent atrial fibrillation.  I am checking laboratory with a comprehensive metabolic panel, magnesium level, CBC, TSH and fasting lipid studies.  I am also scheduling him for a home sleep study for initial assessment of sleep apnea.  I will contact him regarding the results and adjustments will be made.  I will see him in the office for follow-up in approximately 2 to 3 months if he remains stable or sooner as needed.   Medication Adjustments/Labs and Tests Ordered: Current medicines are reviewed at length with the patient today.  Concerns regarding medicines are outlined above.  Medication changes, Labs and Tests ordered today are listed in the Patient Instructions below. Patient Instructions  Medication Instructions:  The current medical regimen is effective;  continue present plan and medications.  *If you need a refill on your cardiac medications before your next appointment, please call your pharmacy*   Lab Work: CMET, MAG, CBC, TSH, LIPID (come back fasting, nothing to eat or drink)   If you have labs (blood work) drawn today and your tests are completely normal, you will receive your results only by: [place] (if you have MyChart) OR A paper copy in the mail If you have any lab test that is abnormal or we need to change your treatment, we will call you to review the results.   Testing/Procedures: Echocardiogram - Your physician has requested that you have an echocardiogram. Echocardiography is a painless test that uses sound waves to create images of your heart. It provides your doctor with information about the size and shape of your heart and how well your heart's chambers and valves are working. This procedure takes approximately one hour. There are no restrictions for this procedure.     Follow-Up: At [facility], you and your health needs are our priority.  As part of our continuing mission to provide you with exceptional heart care, we have created designated Provider Care Teams.  These Care Teams include your primary Cardiologist (physician) and Advanced Practice Providers (APPs -  Physician Assistants and Nurse Practitioners) who all work together to provide you with the care you need, when you need it.  We recommend signing up for the patient portal called "MyChart".  Sign up  information is provided on this After Visit Summary.  MyChart is used to connect with patients for Virtual Visits (Telemedicine).  Patients are able to view lab/test results, encounter notes, upcoming appointments, etc.  Non-urgent messages can be sent to your provider as well.   To learn more about what you can do with MyChart, go to [email or web address].    Your next appointment:   3 month(s)  The format for your next appointment:   In Person  Provider:   Shelva Majestic, MD            Signed, Shelva Majestic, MD  07/09/2021 10:31 AM    [facility] [address] [place], [place], [place]  [address] Phone: [phone]

## 2021-06-29 NOTE — Patient Instructions (Signed)
Medication Instructions:  ?The current medical regimen is effective;  continue present plan and medications. ? ?*If you need a refill on your cardiac medications before your next appointment, please call your pharmacy* ? ? ?Lab Work: ?CMET, MAG, CBC, TSH, LIPID (come back fasting, nothing to eat or drink)  ? ?If you have labs (blood work) drawn today and your tests are completely normal, you will receive your results only by: ?MyChart Message (if you have MyChart) OR ?A paper copy in the mail ?If you have any lab test that is abnormal or we need to change your treatment, we will call you to review the results. ? ? ?Testing/Procedures: ?Echocardiogram - Your physician has requested that you have an echocardiogram. Echocardiography is a painless test that uses sound waves to create images of your heart. It provides your doctor with information about the size and shape of your heart and how well your heart?s chambers and valves are working. This procedure takes approximately one hour. There are no restrictions for this procedure.  ? ? ? ?Follow-Up: ?At [facility], you and your health needs are our priority.  As part of our continuing mission to provide you with exceptional heart care, we have created designated Provider Care Teams.  These Care Teams include your primary Cardiologist (physician) and Advanced Practice Providers (APPs -  Physician Assistants and Nurse Practitioners) who all work together to provide you with the care you need, when you need it. ? ?We recommend signing up for the patient portal called "MyChart".  Sign up information is provided on this After Visit Summary.  MyChart is used to connect with patients for Virtual Visits (Telemedicine).  Patients are able to view lab/test results, encounter notes, upcoming appointments, etc.  Non-urgent messages can be sent to your provider as well.   ?To learn more about what you can do with MyChart, go to [email or web address].   ? ?Your next  appointment:   ?3 month(s) ? ?The format for your next appointment:   ?In Person ? ?Provider:   ?Shelva Majestic, MD  ? ? ? ? ? ? ? ? ?

## 2021-07-09 ENCOUNTER — Encounter: Payer: Self-pay | Admitting: Cardiovascular Disease

## 2021-07-14 ENCOUNTER — Telehealth: Payer: Self-pay | Admitting: Cardiovascular Disease

## 2021-07-14 NOTE — Telephone Encounter (Signed)
Recv'd records from [facility] forwarded 31 pages to Dr. Shelva Majestic 6/1/23fbg

## 2021-07-15 LAB — COMPREHENSIVE METABOLIC PANEL
ALT: 19 IU/L (ref 0–44)
AST: 14 IU/L (ref 0–40)
Albumin/Globulin Ratio: 1.8 (ref 1.2–2.2)
Albumin: 4.5 g/dL (ref 3.8–4.8)
Alkaline Phosphatase: 98 IU/L (ref 44–121)
BUN/Creatinine Ratio: 16 (ref 10–24)
BUN: 13 mg/dL (ref 8–27)
Bilirubin Total: 0.3 mg/dL (ref 0.0–1.2)
CO2: 27 mmol/L (ref 20–29)
Calcium: 10 mg/dL (ref 8.6–10.2)
Chloride: 103 mmol/L (ref 96–106)
Creatinine, Ser: 0.82 mg/dL (ref 0.76–1.27)
Globulin, Total: 2.5 g/dL (ref 1.5–4.5)
Glucose: 90 mg/dL (ref 70–99)
Potassium: 5 mmol/L (ref 3.5–5.2)
Sodium: 141 mmol/L (ref 134–144)
Total Protein: 7 g/dL (ref 6.0–8.5)
eGFR: 95 mL/min/{1.73_m2} (ref >59)

## 2021-07-15 LAB — LIPID PANEL
Chol/HDL Ratio: 4.5 ratio (ref 0.0–5.0)
Cholesterol, Total: 249 mg/dL — ABNORMAL HIGH (ref 100–199)
HDL: 55 mg/dL (ref >39)
LDL Chol Calc (NIH): 167 mg/dL — ABNORMAL HIGH (ref 0–99)
Triglycerides: 151 mg/dL — ABNORMAL HIGH (ref 0–149)
VLDL Cholesterol Cal: 27 mg/dL (ref 5–40)

## 2021-07-15 LAB — CBC
Hematocrit: 44.3 % (ref 37.5–51.0)
Hemoglobin: 15.1 g/dL (ref 13.0–17.7)
MCH: 31.2 pg (ref 26.6–33.0)
MCHC: 34.1 g/dL (ref 31.5–35.7)
MCV: 92 fL (ref 79–97)
Platelets: 348 10*3/uL (ref 150–450)
RBC: 4.84 x10E6/uL (ref 4.14–5.80)
RDW: 11.9 % (ref 11.6–15.4)
WBC: 6.1 10*3/uL (ref 3.4–10.8)

## 2021-07-15 LAB — MAGNESIUM: Magnesium: 2.2 mg/dL (ref 1.6–2.3)

## 2021-07-15 LAB — TSH: TSH: 2.21 u[IU]/mL (ref 0.450–4.500)

## 2021-07-21 ENCOUNTER — Ambulatory Visit (HOSPITAL_COMMUNITY): Payer: Medicare Other | Attending: Cardiology

## 2021-07-21 DIAGNOSIS — R0683 Snoring: Secondary | ICD-10-CM | POA: Diagnosis present

## 2021-07-21 DIAGNOSIS — I480 Paroxysmal atrial fibrillation: Secondary | ICD-10-CM | POA: Diagnosis present

## 2021-07-21 LAB — ECHOCARDIOGRAM COMPLETE
Area-P 1/2: 3.89 cm2
S' Lateral: 2.7 cm

## 2021-08-02 ENCOUNTER — Other Ambulatory Visit: Payer: Self-pay

## 2021-08-02 MED ORDER — ROSUVASTATIN CALCIUM 20 MG PO TABS: 20.0000 mg | ORAL_TABLET | Freq: Every day | ORAL | 3 refills | Status: DC

## 2021-09-02 ENCOUNTER — Ambulatory Visit (HOSPITAL_BASED_OUTPATIENT_CLINIC_OR_DEPARTMENT_OTHER): Payer: Medicare Other | Attending: Cardiovascular Disease | Admitting: Cardiovascular Disease

## 2021-09-02 DIAGNOSIS — G4733 Obstructive sleep apnea (adult) (pediatric): Secondary | ICD-10-CM

## 2021-09-02 DIAGNOSIS — R0683 Snoring: Secondary | ICD-10-CM

## 2021-09-02 DIAGNOSIS — I480 Paroxysmal atrial fibrillation: Secondary | ICD-10-CM

## 2021-09-08 ENCOUNTER — Encounter (HOSPITAL_BASED_OUTPATIENT_CLINIC_OR_DEPARTMENT_OTHER): Payer: Self-pay | Admitting: Cardiovascular Disease

## 2021-09-08 NOTE — Procedures (Signed)
     Patient Name: Donald [place], Man Date: 09/02/2021 Gender: Male D.O.B: Sep 11, 1950 Age (years): 85 Referring Provider: Shelva Majestic MD, ABSM Height (inches): 72 Interpreting Physician: Shelva Majestic MD, ABSM Weight (lbs): 192 RPSGT: Jacolyn Reedy BMI: 26 MRN: [id number] Neck Size: 16.00  CLINICAL INFORMATION Sleep Study Type: HST  Indication for sleep study: Snoring, PAF, difficulty sleeping on back  Epworth Sleepiness Score: 7  SLEEP STUDY TECHNIQUE A multi-channel overnight portable sleep study was performed. The channels recorded were: nasal airflow, thoracic respiratory movement, and oxygen saturation with a pulse oximetry. Snoring was also monitored.  MEDICATIONS apixaban (ELIQUIS) 5 MG TABS tablet cholecalciferol (VITAMIN D) 1000 UNITS tablet flecainide (TAMBOCOR) 100 MG tablet inFLIXimab (REMICADE) 100 MG injection metoprolol tartrate (LOPRESSOR) 25 MG tablet Multiple Vitamins-Minerals (MULTIVITAMIN WITH MINERALS) tablet rosuvastatin (CRESTOR) 20 MG tablet Patient self administered medications include: N/A.  SLEEP ARCHITECTURE Patient was studied for 362.9 minutes. The sleep efficiency was 100.0 % and the patient was supine for 0%. The arousal index was 0.0 per hour.  RESPIRATORY PARAMETERS The overall AHI was 13.1 per hour, with a central apnea index of 0 per hour.  There is a significant positional component with supine sleep AHI 29.4/h versus non-supine sleep AHI 3.4/h.  The oxygen nadir was 86% during sleep.  CARDIAC DATA Mean heart rate during sleep was 60.8 bpm.  IMPRESSIONS - Mild obstructive sleep apnea overall occurred during this study (AHI 13.1/h); however, sleep apnea is moderately severe with supine sleep (AHI 29.4/h). - Moderate oxygen desaturation to a nadir of 86%. - Patient snored 8.7% during the sleep.  DIAGNOSIS - Obstructive Sleep Apnea (G47.33)  RECOMMENDATIONS - Therapeutic CPAP titration to determine optimal pressure  required to alleviate sleep disordered breathing. Recommend initiation of Auto-PAP with EPR of 3 at 6 - 16 cm of water. - Effort should be made to optimize nasal and oropharyngeal patency - Positional therapy avoiding supine position during sleep. - If patient is against CPAP initiation alternative therappy with a customized oral appliance can be considered. - Avoid alcohol, sedatives and other CNS depressants that may worsen sleep apnea and disrupt normal sleep architecture. - Sleep hygiene should be reviewed to assess factors that may improve sleep quality. - Weight management and regular exercise should be initiated or continued. - Recommend a download and sleep clinic evaluation after one month of therapy.   [Electronically signed] 09/08/2021 07:14 PM  Shelva Majestic MD, [facility], [place], American Board of Sleep Medicine  NPI: [id number]  [place] PH: [phone]   FX: [phone] [place]

## 2021-09-26 ENCOUNTER — Telehealth: Payer: Self-pay | Admitting: *Deleted

## 2021-09-26 NOTE — Telephone Encounter (Signed)
Left message to return a call to discuss HST.

## 2021-09-26 NOTE — Telephone Encounter (Signed)
Patient returned a call to me and was given his sleep study results and recommendations. He agrees to proceed with CPAP treatment. Orders sent to [place] via parachute portal.

## 2021-10-05 ENCOUNTER — Encounter: Payer: Self-pay | Admitting: Cardiovascular Disease

## 2021-10-05 ENCOUNTER — Ambulatory Visit (INDEPENDENT_AMBULATORY_CARE_PROVIDER_SITE_OTHER): Payer: Medicare Other | Admitting: Cardiovascular Disease

## 2021-10-05 VITALS — BP 132/72 | HR 62 | Ht 74.0 in | Wt 198.2 lb

## 2021-10-05 DIAGNOSIS — Z7901 Long term (current) use of anticoagulants: Secondary | ICD-10-CM

## 2021-10-05 DIAGNOSIS — G4733 Obstructive sleep apnea (adult) (pediatric): Secondary | ICD-10-CM | POA: Diagnosis not present

## 2021-10-05 DIAGNOSIS — E7800 Pure hypercholesterolemia, unspecified: Secondary | ICD-10-CM

## 2021-10-05 DIAGNOSIS — I341 Nonrheumatic mitral (valve) prolapse: Secondary | ICD-10-CM

## 2021-10-05 DIAGNOSIS — I10 Essential (primary) hypertension: Secondary | ICD-10-CM | POA: Diagnosis not present

## 2021-10-05 DIAGNOSIS — I340 Nonrheumatic mitral (valve) insufficiency: Secondary | ICD-10-CM

## 2021-10-05 DIAGNOSIS — I480 Paroxysmal atrial fibrillation: Secondary | ICD-10-CM | POA: Diagnosis not present

## 2021-10-05 MED ORDER — ROSUVASTATIN CALCIUM 20 MG PO TABS: 20.0000 mg | ORAL_TABLET | Freq: Every day | ORAL | 3 refills | Status: DC

## 2021-10-05 MED ORDER — FLECAINIDE ACETATE 50 MG PO TABS: 50.0000 mg | ORAL_TABLET | Freq: Two times a day (BID) | ORAL | 3 refills | Status: DC

## 2021-10-05 NOTE — Progress Notes (Signed)
Cardiology Office Note    Date:  10/05/2021   ID:  Donald Duncan March 08, 1950, MRN [id number]  PCP:  Patient, No Pcp Per  Cardiologist:  Donald Majestic, MD   3 month follow-up cardiology/sleep evaluation to establish care   History of Present Illness:  Donald Duncan is a 71 y.o. male who is a former patient of Donald Duncan who had recently retired from [employer] in December 2022.  Donald Duncan has a history of initial episode of atrial fibrillation in 2000 with subsequent recurrent atrial fibrillation.  2012 and possibly 2017 treated by Donald Duncan.  He had been maintaining sinus rhythm.  He has a history of colitis, possibly to be Crohn's disease followed by Donald Duncan and has been treated with Remicade.  Recently, he was scheduled to undergo colonoscopy on June 02, 2021 and stopped taking Eliquis on May 29, 2021 in preparation.  Eliquis was restarted following the procedure and later that day the patient felt weak noted shortness of breath and was back in atrial fibrillation.  Of note, the patient had previously only been taking Eliquis once a day in addition to flecainide and metoprolol once a day rather than the scheduled twice daily regimen.  He was seen by Donald Renshaw, Donald Duncan on June 07, 2021 and was scheduled to undergo cardioversion on June 08, 2021 with Donald Duncan.  He has a reported history of mitral valve prolapse with mild mitral insufficiency  Presently, the patient admits to a normal rhythm.  He has desired to transfer care to [facility] since Donald Duncan had retired and he sees Donald Duncan.  presents for new patient evaluation with me.  When I initially saw him he denied any recent chest pain.  He believes he sleeps well however he admits that he snores when on his back and therefore typically sleeps on his side.  He goes to bed between 930 and 10 PM and wakes up at 5 AM he tries to average greater than 7 hours of sleep per night.  He admits to nocturia x1.  At times he admits  to nocturnal palpitations.  During that evaluation, he was maintaining sinus rhythm ECG suggested biatrial enlargement.  He had normal QT interval.  He subsequently underwent a 2D echo Doppler study which was done on July 21, 2021.  Left ventricular ejection fraction by 3D volume was 58%.  There was mild LVH of the basal septal segment.  There was mild grade 1 diastolic dysfunction.  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  Both atrium were normal in size.  Aortic valve was normal.  Due to my concerns with obstructive sleep apnea with significant difficulty when he lies on his back with his history of atrial fibrillation I recommended he undergo a sleep study.  On September 02, 2021 he underwent a home sleep study.  He was found to have overall mild sleep apnea with an AHI of 13.1/h.  However there was a significant positional component with supine sleep AHI at 29.4/h.  Oxygen desaturated to a nadir of 86%.  He snored for 8.7% of sleep.  AutoPap therapy was recommended at an EPR of 3 with a pressure range of 6 to 16 cm of water.  Since I last saw him, he had undergone laboratory July 15, 2021.  Lipid studies were significantly elevated with total cholesterol 249, HDL 55, LDL 167, and  triglycerides 151.  Creatinine was stable at 0.82.  He was started on rosuvastatin 20 mg.  He was recently by [employer] which will be his DME company.  He is still awaiting his rResMed AirSense 11 AutoSet unit.  He had several questions in the office today regarding his sleep study and need for CPAP.  Denies any chest pain.  He is unaware of recurrent A-fib.  He continues to be on Eliquis 5 mg twice a day, flecainide 50 mg twice a day in addition to metoprolol to tartrate 25 mg twice a day.  Maintaining sinus rhythm.  He also is on Remicade every 8 weeks for his ulcerative colitis.  He is tolerating rosuvastatin 20 mg since initiating  therapy.  He presents for follow-up evaluation.  Past Medical History:  Diagnosis Date   Atrial fibrillation (HCC)    Colon polyp, hyperplastic    Crohn's colitis ([place])    Diverticulosis    H/O: iron deficiency anemia    Hemorrhoids     Past Surgical History:  Procedure Laterality Date   COLONOSCOPY     FINGER SURGERY     Thumb ; right    Current Medications: Outpatient Medications Prior to Visit  Medication Sig Dispense Refill   apixaban (ELIQUIS) 5 MG TABS tablet Take 5 mg by mouth 2 (two) times daily.     cholecalciferol (VITAMIN D) 1000 UNITS tablet Take 1,000 Units by mouth daily.       inFLIXimab (REMICADE) 100 MG injection Inject 500 mg into the vein every 8 (eight) weeks. [facility]   [address]   [address]   [place], [place] [address]   [phone] 1 each 11   metoprolol tartrate (LOPRESSOR) 25 MG tablet TAKE ONE TABLET (25 MG TOTAL) BY MOUTH 2 (TWO) TIMES DAILY.     Multiple Vitamins-Minerals (MULTIVITAMIN WITH MINERALS) tablet Take 1 tablet by mouth daily.     flecainide (TAMBOCOR) 100 MG tablet Take by mouth.     rosuvastatin (CRESTOR) 20 MG tablet Take 1 tablet (20 mg total) by mouth daily. 90 tablet 3   0.9 %  sodium chloride infusion      No facility-administered medications prior to visit.     Allergies:   Codeine   Social History   Socioeconomic History   Marital status: Married    Spouse name: Not on file   Number of children: Not on file   Years of education: Not on file   Highest education level: Not on file  Occupational History   Occupation: self employed  Tobacco Use   Smoking status: Former    Types: Cigarettes   Smokeless tobacco: Never   Tobacco comments:    Quit in 2000  Vaping Use   Vaping Use: Never used  Substance and Sexual Activity   Alcohol use: Yes    Comment: Rarely once a year.   Drug use: No   Sexual activity: Not on file  Other Topics Concern   Not on file  Social  History Narrative   Daily caffeine   Social Determinants of Health   Financial Resource Strain: Not on file  Food Insecurity: Not on file  Transportation Needs: Not on file  Physical Activity: Not on file  Stress: Not on file  Social Connections: Not on file    Social history is notable that he was born in [place].  He is married for 25 years and has 1 child.  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  There is remote tobacco history of smoking for 20 years but he quit smoking in 2001.  He drinks occasional beer.  He tries to exercise 5 days/week.  Family History:  The patient's family history includes Alzheimer's disease in his mother; Heart disease in his father and mother; Lung cancer in his father.  His mother has Alzheimer's disease and is 46.  Father has lung cancer and was a smoker.  He has a half brother and 2 sisters.  He has a daughter age 22.  ROS General: Negative; No fevers, chills, or night sweats;  HEENT: Negative; No changes in vision or hearing, sinus congestion, difficulty swallowing Pulmonary: Negative; No cough, wheezing, shortness of breath, hemoptysis Cardiovascular: See HPI GI: Colitis, possible Crohn's on Remicade followed by Donald Duncan GU: Negative; No dysuria, hematuria, or difficulty voiding Musculoskeletal: Negative; no myalgias, joint pain, or weakness Hematologic/Oncology: Negative; no easy bruising, bleeding Endocrine: Negative; no heat/cold intolerance; no diabetes Neuro: Negative; no changes in balance, headaches Skin: Negative; No rashes or skin lesions Psychiatric: Negative; No behavioral problems, depression Sleep: Positive for snoring when sleeping supine.  He typically sleeps for approximately 7 hours.  Occasional nocturnal palpitations.  Nocturia x1.  An Epworth Sleepiness Scale score was calculated in the office today and this endorsed at 4.  No hypersomnolence, bruxism, restless legs, hypnogognic hallucinations, no cataplexy Other  comprehensive 14 point system review is negative.   PHYSICAL EXAM:   VS:  BP 132/72   Pulse 62   Ht 6' 2" (1.88 m)   Wt 198 lb 3.2 oz (89.9 kg)   SpO2 98%   BMI 25.45 kg/m     Repeat blood pressure by me was 124/75  Wt Readings from Last 3 Encounters:  10/05/21 198 lb 3.2 oz (89.9 kg)  06/29/21 192 lb 6.4 oz (87.3 kg)  06/02/21 199 lb (90.3 kg)    General: Alert, oriented, no distress.  Skin: normal turgor, no rashes, warm and dry HEENT: Normocephalic, atraumatic. Pupils equal round and reactive to light; sclera anicteric; extraocular muscles intact;  Nose without nasal septal hypertrophy Mouth/Parynx benign; Mallinpatti scale 3 Neck: No JVD, no carotid bruits; normal carotid upstroke Lungs: clear to ausculatation and percussion; no wheezing or rales Chest wall: without tenderness to palpitation Heart: PMI not displaced, RRR, s1 s2 normal, 1/6 systolic murmurat apex,, no diastolic murmur, no rubs, gallops, thrills, or heaves Abdomen: soft, nontender; no hepatosplenomehaly, BS+; abdominal aorta nontender and not dilated by palpation. Back: no CVA tenderness Pulses 2+ Musculoskeletal: full range of motion, normal strength, no joint deformities Extremities: no clubbing cyanosis or edema, Homan's sign negative  Neurologic: grossly nonfocal; Cranial nerves grossly wnl Psychologic: Normal mood and affect   Studies/Labs Reviewed:   October 05, 2021 ECG (independently read by me):  NSR at 62 with mild sinus arrythmia, QTc 420 msec  Jun 29, 2021 ECG (independently read by me): NSR at 63, biatrial enlargement, QTc 440 msec; PR 176 msec  Recent Labs:    Latest Ref Rng & Units 07/15/2021    8:29 AM 04/27/2021   11:04 AM 05/25/2009    8:49 AM  BMP  Glucose 70 - 99 mg/dL 90  119  85   BUN 8 - 27 mg/dL _0 Creatinine 0.76 - 1.27 mg/dL 0.82  0.87  0.8   BUN/Creat Ratio 10 - 24 16     Sodium 134 - 144 mmol/L 141  140  142   Potassium 3.5 -  5.2 mmol/L 5.0  4.0  4.2    Chloride 96 - 106 mmol/L 103  105  107   CO2 20 - 29 mmol/L _0 Calcium 8.6 - 10.2 mg/dL 10.0  9.6  9.1         Latest Ref Rng & Units 07/15/2021    8:29 AM 04/27/2021   11:04 AM 05/25/2009    8:49 AM  Hepatic Function  Total Protein 6.0 - 8.5 g/dL 7.0  6.8  7.1   Albumin 3.8 - 4.8 g/dL 4.5  4.4  4.1   AST 0 - 40 IU/L _1 ALT 0 - 44 IU/L _2 Alk Phosphatase 44 - 121 IU/L 98  73  71   Total Bilirubin 0.0 - 1.2 mg/dL 0.3  0.4  0.8   Bilirubin, Direct 0.0 - 0.3 mg/dL   0.1        Latest Ref Rng & Units 07/15/2021    8:29 AM 04/27/2021   11:04 AM 05/25/2009    8:49 AM  CBC  WBC 3.4 - 10.8 x10E3/uL 6.1  6.4  5.2   Hemoglobin 13.0 - 17.7 g/dL 15.1  14.8  14.5   Hematocrit 37.5 - 51.0 % 44.3  43.0  42.0   Platelets 150 - 450 x10E3/uL 348  344.0  313.0    Lab Results  Component Value Date   MCV 92 07/15/2021   MCV 93.1 04/27/2021   MCV 91.1 05/25/2009   Lab Results  Component Value Date   TSH 2.210 07/15/2021   No results found for: "HGBA1C"   BNP No results found for: "BNP"  ProBNP No results found for: "PROBNP"   Lipid Panel     Component Value Date/Time   CHOL 249 (H) 07/15/2021 0829   TRIG 151 (H) 07/15/2021 0829   HDL 55 07/15/2021 0829   CHOLHDL 4.5 07/15/2021 0829   LDLCALC 167 (H) 07/15/2021 0829   LABVLDL 27 07/15/2021 0829     RADIOLOGY: SLEEP STUDY DOCUMENTS  Result Date: 09/09/2021 Ordered by an unspecified provider.    Additional studies/ records that were reviewed today include:  I reviewed the records of Donald Duncan as well as [facility].  ECHO: 07/21/2021  1. Left ventricular ejection fraction by 3D volume is 58 %. The left  ventricle has normal function. The left ventricle has no regional wall  motion abnormalities. There is mild left ventricular hypertrophy of the  basal-septal segment. Left ventricular  diastolic parameters are consistent with Grade I diastolic dysfunction  (impaired relaxation).    2. Right ventricular systolic function is normal. The right ventricular  size is normal.   3. The mitral valve is myxomatous. Mild mitral valve regurgitation. No  evidence of mitral stenosis. There is mild holosystolic prolapse of the  middle segment of the anterior leaflet of the mitral valve.   4. The aortic valve is normal in structure. Aortic valve regurgitation is  trivial. No aortic stenosis is present.   5. Pulmonic valve regurgitation is moderate.   6. The inferior vena cava is normal in size with greater than 50%  respiratory variability, suggesting right atrial pressure of 3 mmHg.        Patient Name: Donald Duncan, Donald Duncan Date: 09/02/2021 Gender: Male D.O.B: [birth year] Age (years): 50 Referring Provider: Shelva Majestic MD, ABSM Height (inches): 72 Interpreting Physician: Donald Majestic MD, ABSM Weight (lbs): 192 RPSGT: Jacolyn Reedy BMI: 26  MRN: [id number] Neck Size: 16.00   CLINICAL INFORMATION Sleep Study Type: HST   Indication for sleep study: Snoring, PAF, difficulty sleeping on back   Epworth Sleepiness Score: 7   SLEEP STUDY TECHNIQUE A multi-channel overnight portable sleep study was performed. The channels recorded were: nasal airflow, thoracic respiratory movement, and oxygen saturation with a pulse oximetry. Snoring was also monitored.   MEDICATIONS apixaban (ELIQUIS) 5 MG TABS tablet cholecalciferol (VITAMIN D) 1000 UNITS tablet flecainide (TAMBOCOR) 100 MG tablet inFLIXimab (REMICADE) 100 MG injection metoprolol tartrate (LOPRESSOR) 25 MG tablet Multiple Vitamins-Minerals (MULTIVITAMIN WITH MINERALS) tablet rosuvastatin (CRESTOR) 20 MG tablet Patient self administered medications include: N/A.   SLEEP ARCHITECTURE Patient was studied for 362.9 minutes. The sleep efficiency was 100.0 % and the patient was supine for 0%. The arousal index was 0.0 per hour.   RESPIRATORY PARAMETERS The overall AHI was 13.1 per hour, with a central apnea index of 0  per hour.  There is a significant positional component with supine sleep AHI 29.4/h versus non-supine sleep AHI 3.4/h.   The oxygen nadir was 86% during sleep.   CARDIAC DATA Mean heart rate during sleep was 60.8 bpm.   IMPRESSIONS - Mild obstructive sleep apnea overall occurred during this study (AHI 13.1/h); however, sleep apnea is moderately severe with supine sleep (AHI 29.4/h). - Moderate oxygen desaturation to a nadir of 86%. - Patient snored 8.7% during the sleep.   DIAGNOSIS - Obstructive Sleep Apnea (G47.33)   RECOMMENDATIONS - Therapeutic CPAP titration to determine optimal pressure required to alleviate sleep disordered breathing. Recommend initiation of Auto-PAP with EPR of 3 at 6 - 16 cm of water. - Effort should be made to optimize nasal and oropharyngeal patency - Positional therapy avoiding supine position during sleep. - If patient is against CPAP initiation alternative therappy with a customized oral appliance can be considered. - Avoid alcohol, sedatives and other CNS depressants that may worsen sleep apnea and disrupt normal sleep architecture. - Sleep hygiene should be reviewed to assess factors that may improve sleep quality. - Weight management and regular exercise should be initiated or continued. - Recommend a download and sleep clinic evaluation after one month of therapy.    ASSESSMENT:    1. Paroxysmal atrial fibrillation (HCC)   2. OSA (obstructive sleep apnea)   3. Essential hypertension   4. Pure hypercholesterolemia   5. Chronic anticoagulation   6. Mitral valve prolapse   7. Nonrheumatic mitral valve regurgitation     PLAN:  Mr. Lillie Bollig is a 71 year old gentleman who has a history of hypertension,  mitral valve prolapse with mild MR as well as paroxysmal atrial fibrillation and has undergone prior cardioversions in 2000, 2012 ANDapproximately 2017.  He had been on Eliquis in addition to flecainide and metoprolol tartrate.  However recently,  the patient was only taking flecainide and metoprolol once a day.  He had held his Eliquis to undergo colonoscopy which was done on June 02, 2020 by Donald Duncan and subsequently developed episodes of lightheadedness and shortness of breath, was evaluated at [facility] cardiology and was back in atrial fibrillation.  He was advised to take his medicines twice a day and was scheduled for cardioversion.  I saw him for my initial evaluation on Jun 29, 2021 he was in sinus rhythm.  QTc interval was normal.  He underwent an echo Doppler study which showed normal LV systolic function with 3D EF at 58%.  He had mild holosystolic prolapse of the middle segment of  the anterior mitral valve leaflet with mild mitral regurgitation.  Both atrium are normal size.  Laboratory revealed significant hyperlipidemia with LDL cholesterol 167, total cholesterol 149.  Triglycerides are 151.  He has recently been started on rosuvastatin therapy following his June laboratory.  Due to my concerns for obstructive sleep apnea a sleep study was done which revealed mild overall sleep apnea but was approaching severe sleep apnea with supine sleep.  He has been contacted by [facility] and has not yet received his CPAP unit.  I had a lengthy discussion with him today and reviewed the effects of untreated sleep apnea on his cardiovascular health particularly its implications with reference to blood pressure control, nocturnal arrhythmias, increased risk for atrial fibrillation, effects on insulin resistance, increased inflammatory markers, increased GERD, potential for nocturnal hypoxemia contributing to nocturnal ischemia.  He had concerns about wearing a mask.  I am recommending he initiate therapy with a ResMed AirFit N30i fullface mask as necessary then use an F30i.  Discussed with him the importance of meeting compliance and the need for me to reevaluate him within 90 days following CPAP initiation.  Presently, his blood pressure is well  controlled.  ECG shows he is maintaining sinus rhythm.  QTc interval is normal at 420 ms on flecainide.  I have recommended follow-up CMET and lipid panel as well as LP(a) in October and I plan to see him in follow-up in November for cardiology/ sleep evaluation    Medication Adjustments/Labs and Tests Ordered: Current medicines are reviewed at length with the patient today.  Concerns regarding medicines are outlined above.  Medication changes, Labs and Tests ordered today are listed in the Patient Instructions below. Patient Instructions  Medication Instructions:  Your physician recommends that you continue on your current medications as directed. Please refer to the Current Medication list given to you today.  *If you need a refill on your cardiac medications before your next appointment, please call your pharmacy*   Lab Work: Your physician recommends that you return for lab work in: October for FASTING CMET, lipids, LPA  If you have labs (blood work) drawn today and your tests are completely normal, you will receive your results only by: [place] (if you have MyChart) OR A paper copy in the mail If you have any lab test that is abnormal or we need to change your treatment, we will call you to review the results.   Follow-Up: At [facility], you and your health needs are our priority.  As part of our continuing mission to provide you with exceptional heart care, we have created designated Provider Care Teams.  These Care Teams include your primary Cardiologist (physician) and Advanced Practice Providers (APPs -  Physician Assistants and Nurse Practitioners) who all work together to provide you with the care you need, when you need it.  We recommend signing up for the patient portal called "MyChart".  Sign up information is provided on this After Visit Summary.  MyChart is used to connect with patients for Virtual Visits (Telemedicine).  Patients are able to view lab/test  results, encounter notes, upcoming appointments, etc.  Non-urgent messages can be sent to your provider as well.   To learn more about what you can do with MyChart, go to [email or web address].    Your next appointment:   3 month(s)  The format for your next appointment:   In Person  Provider:   Shelva Majestic, MD   Signed, Donald Majestic, MD  10/05/2021 8:59 AM  [facility] [address] [place], [place], [place]  [address] Phone: [phone]

## 2021-10-05 NOTE — Patient Instructions (Signed)
Medication Instructions:  Your physician recommends that you continue on your current medications as directed. Please refer to the Current Medication list given to you today.  *If you need a refill on your cardiac medications before your next appointment, please call your pharmacy*   Lab Work: Your physician recommends that you return for lab work in: October for FASTING CMET, lipids, LPA  If you have labs (blood work) drawn today and your tests are completely normal, you will receive your results only by: [place] (if you have MyChart) OR A paper copy in the mail If you have any lab test that is abnormal or we need to change your treatment, we will call you to review the results.   Follow-Up: At [facility], you and your health needs are our priority.  As part of our continuing mission to provide you with exceptional heart care, we have created designated Provider Care Teams.  These Care Teams include your primary Cardiologist (physician) and Advanced Practice Providers (APPs -  Physician Assistants and Nurse Practitioners) who all work together to provide you with the care you need, when you need it.  We recommend signing up for the patient portal called "MyChart".  Sign up information is provided on this After Visit Summary.  MyChart is used to connect with patients for Virtual Visits (Telemedicine).  Patients are able to view lab/test results, encounter notes, upcoming appointments, etc.  Non-urgent messages can be sent to your provider as well.   To learn more about what you can do with MyChart, go to [email or web address].    Your next appointment:   3 month(s)  The format for your next appointment:   In Person  Provider:   Shelva Majestic, MD

## 2021-12-13 LAB — LIPID PANEL
Chol/HDL Ratio: 2.4 ratio (ref 0.0–5.0)
Cholesterol, Total: 128 mg/dL (ref 100–199)
HDL: 54 mg/dL (ref >39)
LDL Chol Calc (NIH): 59 mg/dL (ref 0–99)
Triglycerides: 73 mg/dL (ref 0–149)
VLDL Cholesterol Cal: 15 mg/dL (ref 5–40)

## 2021-12-13 LAB — COMPREHENSIVE METABOLIC PANEL
ALT: 25 IU/L (ref 0–44)
AST: 20 IU/L (ref 0–40)
Albumin/Globulin Ratio: 2.1 (ref 1.2–2.2)
Albumin: 4.6 g/dL (ref 3.9–4.9)
Alkaline Phosphatase: 91 IU/L (ref 44–121)
BUN/Creatinine Ratio: 20 (ref 10–24)
BUN: 19 mg/dL (ref 8–27)
Bilirubin Total: 0.4 mg/dL (ref 0.0–1.2)
CO2: 27 mmol/L (ref 20–29)
Calcium: 10 mg/dL (ref 8.6–10.2)
Chloride: 105 mmol/L (ref 96–106)
Creatinine, Ser: 0.93 mg/dL (ref 0.76–1.27)
Globulin, Total: 2.2 g/dL (ref 1.5–4.5)
Glucose: 98 mg/dL (ref 70–99)
Potassium: 5.1 mmol/L (ref 3.5–5.2)
Sodium: 142 mmol/L (ref 134–144)
Total Protein: 6.8 g/dL (ref 6.0–8.5)
eGFR: 89 mL/min/{1.73_m2} (ref >59)

## 2021-12-13 LAB — LIPOPROTEIN A (LPA): Lipoprotein (a): 25.6 nmol/L (ref <75.0)

## 2022-01-09 ENCOUNTER — Ambulatory Visit: Payer: Medicare Other | Admitting: Cardiovascular Disease

## 2022-01-19 ENCOUNTER — Encounter: Payer: Self-pay | Admitting: Cardiovascular Disease

## 2022-01-19 MED ORDER — ATORVASTATIN CALCIUM 20 MG PO TABS: 20.0000 mg | ORAL_TABLET | Freq: Every day | ORAL | 6 refills | Status: DC

## 2022-02-14 NOTE — Progress Notes (Unsigned)
Cardiology Office Note:    Date:  02/15/2022   ID:  Donald Duncan, DOB September 13, 1950, MRN [id number]  PCP:  Patient, No Pcp Per [place] Cardiologist: Donald Majestic, MD   Reason for visit: 1-monthfollow-up  History of Present Illness:    Donald OLMEDAis a 72y.o. male with a hx of atrial fibrillation, ulcerative colitis, mitral valve prolapse and mitral insufficiency, hyperlipidemia, mild sleep apnea.  He last saw Dr. KClaiborne Billingsin August 2023.  He was waiting for his CPAP.  He was unaware of recurrent A-fib.  Today, he states issues tolerating his CPAP mask.  He states he feels claustrophobic and has itching around the mask after 3 to 4 hours of sleep.  He does not feel any more refreshed or less fatigued if he wears the mask.  He is interested in other alternatives/mask options to treat his sleep apnea.  Otherwise, he states no palpitations in the past year.  His last A-fib was in 2022.  He typically feels tired and has dyspnea on exertion with A-fib.  Typically he feels like he is starting a cold and then his wife will feel an irregular pulse.  He states his flecainide was decreased from 50 mg twice daily to 25 mg twice daily.  He mentions he did not tolerate rosuvastatin because it tore up his stomach.  He was recently switched to atorvastatin in the past week which he is doing better with.  He denies chest pain, shortness of breath, PND, orthopnea, lower extremity edema and lightheadedness.  No bleeding issues on Eliquis.  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     Past Medical History:  Diagnosis Date   Atrial fibrillation (HCC)    Colon polyp, hyperplastic    Crohn's colitis ([place]    Diverticulosis    H/O: iron deficiency anemia    Hemorrhoids     Past Surgical History:  Procedure Laterality Date   COLONOSCOPY     FINGER SURGERY     Thumb ; right    Current Medications: Current Meds  Medication Sig    apixaban (ELIQUIS) 5 MG TABS tablet Take 5 mg by mouth 2 (two) times daily.   atorvastatin (LIPITOR) 20 MG tablet Take 1 tablet (20 mg total) by mouth daily.   cholecalciferol (VITAMIN D) 1000 UNITS tablet Take 1,000 Units by mouth daily.     flecainide (TAMBOCOR) 50 MG tablet Take 1 tablet (50 mg total) by mouth 2 (two) times daily. (Patient taking differently: Take 25 mg by mouth 2 (two) times daily.)   inFLIXimab (REMICADE) 100 MG injection Inject 500 mg into the vein every 8 (eight) weeks. [facility]  [address]   [address]   [id number]  [phone]  metoprolol tartrate (LOPRESSOR) 25 MG tablet TAKE ONE TABLET (25 MG TOTAL) BY MOUTH 2 (TWO) TIMES DAILY.   Multiple Vitamins-Minerals (MULTIVITAMIN WITH MINERALS) tablet Take 1 tablet by mouth daily.     Allergies:   Codeine   Social History   Socioeconomic History   Marital status: Married    Spouse name: Not on file   Number of children: Not on file   Years of education: Not on file   Highest education level: Not on file  Occupational History   Occupation: self employed  Tobacco Use   Smoking status: Former  Types: Cigarettes   Smokeless tobacco: Never   Tobacco comments:    Quit in 2000  Vaping Use   Vaping Use: Never used  Substance and Sexual Activity   Alcohol use: Yes    Comment: Rarely once a year.   Drug use: No   Sexual activity: Not on file  Other Topics Concern   Not on file  Social History Narrative   Daily caffeine   Social Determinants of Health   Financial Resource Strain: Not on file  Food Insecurity: Not on file  Transportation Needs: Not on file  Physical Activity: Not on file  Stress: Not on file  Social Connections: Not on file     Family History: The patient's family history includes Alzheimer's disease in his mother; Heart disease in his father and mother; Lung cancer in his father. There is no history of Colon cancer,  Esophageal cancer, Rectal cancer, or Stomach cancer.  ROS:   Please see the history of present illness.     EKGs/Labs/Other Studies Reviewed:    EKG:  The ekg ordered today demonstrates normal sinus rhythm with heart rate 77, PR interval 158 ms, QRS duration 76 ms.  QT 410 ms.  Recent Labs: 07/15/2021: Hemoglobin 15.1; Magnesium 2.2; Platelets 348; TSH 2.210 12/12/2021: ALT 25; BUN 19; Creatinine, Ser 0.93; Potassium 5.1; Sodium 142   Recent Lipid Panel Lab Results  Component Value Date/Time   CHOL 128 12/12/2021 08:49 AM   TRIG 73 12/12/2021 08:49 AM   HDL 54 12/12/2021 08:49 AM   LDLCALC 59 12/12/2021 08:49 AM    Physical Exam:    VS:  BP 135/75   Pulse 77   Ht '6\' 2"'$  (1.88 m)   Wt 197 lb (89.4 kg)   SpO2 98%   BMI 25.29 kg/m    No data found.       Wt Readings from Last 3 Encounters:  02/15/22 197 lb (89.4 kg)  10/05/21 198 lb 3.2 oz (89.9 kg)  06/29/21 192 lb 6.4 oz (87.3 kg)     GEN:  Well nourished, well developed in no acute distress HEENT: Normal NECK: No JVD; No carotid bruits CARDIAC: RRR, no murmurs, rubs, gallops RESPIRATORY:  Clear to auscultation without rales, wheezing or rhonchi  ABDOMEN: Soft, non-tender, non-distended MUSCULOSKELETAL: No edema SKIN: Warm and dry NEUROLOGIC:  Alert and oriented PSYCHIATRIC:  Normal affect     ASSESSMENT AND PLAN   Paroxysmal atrial fibrillation Chronic Anticoagulation Flecainide monitoring -Cardioversions in 2000, 2012 and 2017 -NSR on EKG 09/2021; EKG today NSR - no AV block -Echo in June 2023 with normal EF, mild LVH, mild MR, mild mitral valve prolapse, normal left atrial size. -Continue flecainide for rhythm control and metoprolol for rate control -Continue Eliquis for stroke prevention.  Recommend avoiding NSAIDs and aspirin while taking Eliquis.    Hypertension, well controlled -Continue current medications. -Goal BP is <130/80.  Recommend DASH diet (high in vegetables, fruits, low-fat dairy  products, whole grains, poultry, fish, and nuts and low in sweets, sugar-sweetened beverages, and red meats), salt restriction and increase physical activity.  Hyperlipidemia well controlled -Lipids December 12, 2021 with total cholesterol 128, HDL 54, LDL 59 and trig 73 (LDL prior to meds 167 in 07/2021) -He was recently switched from Crestor to Lipitor - check lipids in 6 months to ensure LDL remains at goal with med change -Discussed cholesterol lowering diets - Mediterranean diet, DASH diet, vegetarian diet, low-carbohydrate diet and avoidance of trans fats.  Discussed healthier choice  substitutes.  Nuts, high-fiber foods, and fiber supplements may also improve lipids.    Sleep apnea -Issues tolerating CPAP mask.   -Contacted Barry Brunner to help patient with suggestions/alternatives.       Disposition - Follow-up in 6 months with Dr. Claiborne Billings.   Medication Adjustments/Labs and Tests Ordered: Current medicines are reviewed at length with the patient today.  Concerns regarding medicines are outlined above.  Orders Placed This Encounter  Procedures   Lipid panel   EKG 12-Lead   No orders of the defined types were placed in this encounter.   Patient Instructions  Medication Instructions:  No Changes *If you need a refill on your cardiac medications before your next appointment, please call your pharmacy*   Lab Work: Lipid Panel . If you have labs (blood work) drawn today and your tests are completely normal, you will receive your results only by: [place] (if you have MyChart) OR A paper copy in the mail If you have any lab test that is abnormal or we need to change your treatment, we will call you to review the results.   Testing/Procedures: No Testing.   Follow-Up: At [facility] [address], you and your health needs are our priority.  As part of our continuing mission to provide you with exceptional heart care, we have created designated Provider Care Teams.  These  Care Teams include your primary Cardiologist (physician) and Advanced Practice Providers (APPs -  Physician Assistants and Nurse Practitioners) who all work together to provide you with the care you need, when you need it.  We recommend signing up for the patient portal called "MyChart".  Sign up information is provided on this After Visit Summary.  MyChart is used to connect with patients for Virtual Visits (Telemedicine).  Patients are able to view lab/test results, encounter notes, upcoming appointments, etc.  Non-urgent messages can be sent to your provider as well.   To learn more about what you can do with MyChart, go to [email or web address].    Your next appointment:   6 month(s)  The format for your next appointment:   In Person  Provider:   Shelva Majestic, MD    Signed, Warren Lacy, PA-C  02/15/2022 8:38 AM    [place]

## 2022-02-15 ENCOUNTER — Ambulatory Visit: Payer: Medicare Other | Attending: Cardiovascular Disease | Admitting: Physician Assistant

## 2022-02-15 ENCOUNTER — Encounter: Payer: Self-pay | Admitting: Physician Assistant

## 2022-02-15 VITALS — BP 135/75 | HR 77 | Ht 74.0 in | Wt 197.0 lb

## 2022-02-15 DIAGNOSIS — E7800 Pure hypercholesterolemia, unspecified: Secondary | ICD-10-CM | POA: Insufficient documentation

## 2022-02-15 DIAGNOSIS — Z7901 Long term (current) use of anticoagulants: Secondary | ICD-10-CM | POA: Insufficient documentation

## 2022-02-15 DIAGNOSIS — Z79899 Other long term (current) drug therapy: Secondary | ICD-10-CM | POA: Diagnosis present

## 2022-02-15 DIAGNOSIS — I480 Paroxysmal atrial fibrillation: Secondary | ICD-10-CM | POA: Diagnosis not present

## 2022-02-15 DIAGNOSIS — Z5181 Encounter for therapeutic drug level monitoring: Secondary | ICD-10-CM | POA: Diagnosis not present

## 2022-02-15 NOTE — Patient Instructions (Signed)
Medication Instructions:  No Changes *If you need a refill on your cardiac medications before your next appointment, please call your pharmacy*   Lab Work: Lipid Panel . If you have labs (blood work) drawn today and your tests are completely normal, you will receive your results only by: [place] (if you have MyChart) OR A paper copy in the mail If you have any lab test that is abnormal or we need to change your treatment, we will call you to review the results.   Testing/Procedures: No Testing.   Follow-Up: At [facility], you and your health needs are our priority.  As part of our continuing mission to provide you with exceptional heart care, we have created designated Provider Care Teams.  These Care Teams include your primary Cardiologist (physician) and Advanced Practice Providers (APPs -  Physician Assistants and Nurse Practitioners) who all work together to provide you with the care you need, when you need it.  We recommend signing up for the patient portal called "MyChart".  Sign up information is provided on this After Visit Summary.  MyChart is used to connect with patients for Virtual Visits (Telemedicine).  Patients are able to view lab/test results, encounter notes, upcoming appointments, etc.  Non-urgent messages can be sent to your provider as well.   To learn more about what you can do with MyChart, go to [email or web address].    Your next appointment:   6 month(s)  The format for your next appointment:   In Person  Provider:   Shelva Majestic, MD

## 2022-02-28 ENCOUNTER — Telehealth: Payer: Self-pay | Admitting: Cardiovascular Disease

## 2022-02-28 ENCOUNTER — Telehealth: Payer: Medicare Other | Admitting: Physician Assistant

## 2022-02-28 DIAGNOSIS — U071 COVID-19: Secondary | ICD-10-CM

## 2022-02-28 NOTE — Telephone Encounter (Signed)
Spoke to patient . Informed patient Dr Claiborne Billings would not be able to  manage his covid dx. Patient states wife  tested positive and he tested positive yesterday. He does not have any symptoms other than runny nose.  RN recommend  setting up a e visit with urgent care or virtual visit.  He can also contact a [facility] minute visit.  Patient states he will be picking up medication for his wife.   RN informed patient recommendation to quarantine for at least 5 days, if symptoms occur he should quarantine for another 5 days.  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 RN  informed patient to at least wear amask. Pt verbalized understanding.

## 2022-02-28 NOTE — Telephone Encounter (Signed)
Pt states he has covid and wants to know if Dr. Claiborne Billings will prescribe him something. Pt states he does not have PCP

## 2022-02-28 NOTE — Progress Notes (Signed)
   Thank you for the details you included in the comment boxes. Those details are very helpful in determining the best course of treatment for you and help [place] to provide the best care.Because you are COVID + and have a health condition that can compromise your immune system, and are a candidate for an antiviral medication, we recommend that you convert this visit to a video visit in order for the provider to better assess what is going on.  The provider will be able to give you a more accurate diagnosis and treatment plan if we can more freely discuss your symptoms and with the addition of a virtual examination.   If you convert to a video visit, we will bill your insurance (similar to an office visit) and you will not be charged for this e-Visit. You will be able to stay at home and speak with the first available [facility] advanced practice provider. The link to do a video visit is in the drop down Menu tab of your Welcome screen in [place].

## 2022-03-13 ENCOUNTER — Telehealth: Payer: Self-pay | Admitting: Cardiovascular Disease

## 2022-03-13 NOTE — Telephone Encounter (Signed)
Calling wanting to know if notes can be addend to state whether or not CPAP is benefiting to pt. Please advise

## 2022-03-13 NOTE — Telephone Encounter (Signed)
Attempt to return call to Santiago Glad at [facility] has OV 2/2 with Dr. Claiborne Billings.

## 2022-03-17 ENCOUNTER — Ambulatory Visit: Payer: Medicare Other | Attending: Cardiovascular Disease | Admitting: Cardiovascular Disease

## 2022-03-17 ENCOUNTER — Encounter: Payer: Self-pay | Admitting: Cardiovascular Disease

## 2022-03-17 VITALS — BP 120/76 | HR 73 | Ht 74.0 in | Wt 197.0 lb

## 2022-03-17 DIAGNOSIS — G4733 Obstructive sleep apnea (adult) (pediatric): Secondary | ICD-10-CM | POA: Insufficient documentation

## 2022-03-17 DIAGNOSIS — I10 Essential (primary) hypertension: Secondary | ICD-10-CM | POA: Diagnosis not present

## 2022-03-17 DIAGNOSIS — I340 Nonrheumatic mitral (valve) insufficiency: Secondary | ICD-10-CM | POA: Insufficient documentation

## 2022-03-17 DIAGNOSIS — I341 Nonrheumatic mitral (valve) prolapse: Secondary | ICD-10-CM

## 2022-03-17 DIAGNOSIS — I480 Paroxysmal atrial fibrillation: Secondary | ICD-10-CM | POA: Insufficient documentation

## 2022-03-17 DIAGNOSIS — Z7901 Long term (current) use of anticoagulants: Secondary | ICD-10-CM

## 2022-03-17 DIAGNOSIS — E7800 Pure hypercholesterolemia, unspecified: Secondary | ICD-10-CM | POA: Diagnosis present

## 2022-03-17 LAB — LIPID PANEL
Chol/HDL Ratio: 2.8 ratio (ref 0.0–5.0)
Cholesterol, Total: 149 mg/dL (ref 100–199)
HDL: 54 mg/dL (ref >39)
LDL Chol Calc (NIH): 82 mg/dL (ref 0–99)
Triglycerides: 67 mg/dL (ref 0–149)
VLDL Cholesterol Cal: 13 mg/dL (ref 5–40)

## 2022-03-17 NOTE — Progress Notes (Unsigned)
Cardiology Office Note    Date:  03/18/2022   ID:  Nickolaus, Bordelon Oct 27, 1950, MRN [id number]  PCP:  Patient, No Pcp Per  Cardiologist:  Shelva Majestic, MD   6 month follow-up cardiology/sleep evaluation   History of Present Illness:  SID GREENER is a 72 y.o. male who is a former patient of Dr. Arrie Aran who had recently retired from [employer] in December 2022.  Mr. Jezewski has a history of initial episode of atrial fibrillation in 2000 with subsequent recurrent atrial fibrillation.  2012 and possibly 2017 treated by Dr. Fabienne Bruns.  He had been maintaining sinus rhythm.  He has a history of colitis, possibly to be Crohn's disease followed by Dr. Scarlette Shorts and has been treated with Remicade.  He was scheduled to undergo colonoscopy on June 02, 2021 and stopped taking Eliquis on May 29, 2021 in preparation.  Eliquis was restarted following the procedure and later that day the patient felt weak noted shortness of breath and was back in atrial fibrillation.  Of note, the patient had previously only been taking Eliquis once a day in addition to flecainide and metoprolol once a day rather than the scheduled twice daily regimen.  He was seen by Caprice Renshaw, NP on June 07, 2021 and was scheduled to undergo cardioversion on June 08, 2021 with Dr. Jac Canavan.  He has a reported history of mitral valve prolapse with mild mitral insufficiency  Presently, the patient admits to a normal rhythm.  He has desired to transfer care to [facility] since Dr. Fabienne Bruns had retired and he sees Dr. Henrene Pastor.  presents for new patient evaluation with me.  When I initially saw him on Jun 29, 2021 he denied any recent chest pain.  He felt that he sleeps well well however he admits that he snores when on his back and therefore typically sleeps on his side.  He goes to bed between 9:30 and 10 PM and wakes up at 5 AM he tries to average greater than 7 hours of sleep per night.  He admits to nocturia x1.  At times he admits  to nocturnal palpitations.  During that evaluation, he was maintaining sinus rhythm ECG suggested biatrial enlargement.  He had normal QT interval.  He subsequently underwent a 2D echo Doppler study which was done on July 21, 2021.  Left ventricular ejection fraction by 3D volume was 58%.  There was mild LVH of the basal septal segment.  There was mild grade 1 diastolic dysfunction.  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  Both atrium were normal in size.  Aortic valve was normal.  Due to my concerns with obstructive sleep apnea with significant difficulty when he lies on his back with his history of atrial fibrillation I recommended he undergo a sleep study.  On September 02, 2021 he underwent a home sleep study.  He was found to have overall mild sleep apnea with an AHI of 13.1/h.  However there was a significant positional component with supine sleep AHI at 29.4/h.  Oxygen desaturated to a nadir of 86%.  He snored for 8.7% of sleep.  AutoPap therapy was recommended at an EPR of 3 with a pressure range of 6 to 16 cm of water.  I saw him for follow-up evaluation on October 05, 2021.  He had undergone laboratory July 15, 2021.  Lipid  studies were significantly elevated with total cholesterol 249, HDL 55, LDL 167, and triglycerides 151.  Creatinine was stable at 0.82.  He was started on rosuvastatin 20 mg.  He was recently by [employer] which will be his DME company.  He is still awaiting his rResMed AirSense 11 AutoSet unit.  He had several questions in the office today regarding his sleep study and need for CPAP.  He denied any chest pain.  He was unaware of recurrent A-fib.  He continues to be on Eliquis 5 mg twice a day, flecainide 50 mg twice a day in addition to metoprolol to tartrate 25 mg twice a day.  Maintaining sinus rhythm.  He also is on Remicade every 8 weeks for his ulcerative colitis.  He is  tolerating rosuvastatin 20 mg since initiating therapy.    Since I saw him, he received a new ResMed AirSense 11 AutoSet unit on November 03, 2021.  Initial download from October 25 through January 05, 2022 was consistent with meeting compliance with 100% use.  He just met compliance with usage greater than 4 hours at 70%.  His average use was 4 hours and 31 minutes/day.  His unit was set at a pressure range of 6 to 16 cm of water and his 95th percentile pressure was 8.7 with maximum average pressure 9.4.  AHI was 1.5.  In January 2024 he developed COVID.  As result, he stopped using therapy on January 14 and had not yet resumed treatment.  He recently has tested negative for COVID.  He recently saw Caron Presume on February 15, 2022 and was maintaining sinus rhythm on flecainide for rhythm control and metoprolol for rate control.  He continues to be on Eliquis for stroke prevention.  Most recent lipid studies in October 2023 showed a total cholesterol 128, HDL 54, LDL 59, and triglycerides 73.  There was marked benefit from initial LDL prior to treatment at 167 and he had recently been switched from Crestor to atorvastatin 20 mg.  He is unaware of any recurrent A-fib.  He presents for follow-up evaluation.  Past Medical History:  Diagnosis Date   Atrial fibrillation (HCC)    Colon polyp, hyperplastic    Crohn's colitis ([place])    Diverticulosis    H/O: iron deficiency anemia    Hemorrhoids     Past Surgical History:  Procedure Laterality Date   COLONOSCOPY     FINGER SURGERY     Thumb ; right    Current Medications: Outpatient Medications Prior to Visit  Medication Sig Dispense Refill   apixaban (ELIQUIS) 5 MG TABS tablet Take 5 mg by mouth 2 (two) times daily.     atorvastatin (LIPITOR) 20 MG tablet Take 1 tablet (20 mg total) by mouth daily. 30 tablet 6   cholecalciferol (VITAMIN D) 1000 UNITS tablet Take 1,000 Units by mouth daily.       flecainide (TAMBOCOR) 50 MG tablet Take 1  tablet (50 mg total) by mouth 2 (two) times daily. (Patient taking differently: Take 25 mg by mouth 2 (two) times daily.) 180 tablet 3   inFLIXimab (REMICADE) 100 MG injection Inject 500 mg into the vein every 8 (eight) weeks. [facility]   [address]   [address]   [place], [place] [address]   [phone] 1 each 11   metoprolol tartrate (LOPRESSOR) 25 MG tablet TAKE ONE TABLET (25 MG TOTAL) BY MOUTH 2 (TWO) TIMES DAILY.     Multiple Vitamins-Minerals (  MULTIVITAMIN WITH MINERALS) tablet Take 1 tablet by mouth daily.     No facility-administered medications prior to visit.     Allergies:   Codeine   Social History   Socioeconomic History   Marital status: Married    Spouse name: Not on file   Number of children: Not on file   Years of education: Not on file   Highest education level: Not on file  Occupational History   Occupation: self employed  Tobacco Use   Smoking status: Former    Types: Cigarettes   Smokeless tobacco: Never   Tobacco comments:    Quit in 2000  Vaping Use   Vaping Use: Never used  Substance and Sexual Activity   Alcohol use: Yes    Comment: Rarely once a year.   Drug use: No   Sexual activity: Not on file  Other Topics Concern   Not on file  Social History Narrative   Daily caffeine   Social Determinants of Health   Financial Resource Strain: Not on file  Food Insecurity: Not on file  Transportation Needs: Not on file  Physical Activity: Not on file  Stress: Not on file  Social Connections: Not on file    Social history is notable that he was born in [place].  He is married for 25 years and has 1 child.  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  There is remote tobacco history of smoking for 20 years but he quit smoking in 2001.  He drinks occasional beer.  He tries to exercise 5 days/week.  Family History:  The patient's family history includes Alzheimer's disease in his mother; Heart disease  in his father and mother; Lung cancer in his father.  His mother has Alzheimer's disease and is 32.  Father has lung cancer and was a smoker.  He has a half brother and 2 sisters.  He has a daughter age 39.  ROS General: Negative; No fevers, chills, or night sweats;  HEENT: Negative; No changes in vision or hearing, sinus congestion, difficulty swallowing Pulmonary: Negative; No cough, wheezing, shortness of breath, hemoptysis Cardiovascular: See HPI GI: Colitis, possible Crohn's on Remicade followed by Dr. Scarlette Shorts GU: Negative; No dysuria, hematuria, or difficulty voiding Musculoskeletal: Negative; no myalgias, joint pain, or weakness Hematologic/Oncology: Negative; no easy bruising, bleeding Endocrine: Negative; no heat/cold intolerance; no diabetes Neuro: Negative; no changes in balance, headaches Skin: Negative; No rashes or skin lesions Psychiatric: Negative; No behavioral problems, depression Sleep: Positive for snoring when sleeping supine.  He typically sleeps for approximately 7 hours.  Occasional nocturnal palpitations.  Nocturia x1.   He was started on CPAP therapy with set up date November 03, 2021.  With treatment, he is unaware of breakthrough snoring.  His sleep is more restorative.  He denies daytime sleepiness. An Epworth Sleepiness Scale score was recalculated in the office today and this endorsed at 6.  Other comprehensive 14 point system review is negative.   PHYSICAL EXAM:   VS:  BP 120/76   Pulse 73   Ht '6\' 2"'$  (1.88 m)   Wt 197 lb (89.4 kg)   SpO2 97%   BMI 25.29 kg/m     Repeat blood pressure by me was 112/72  Wt Readings from Last 3 Encounters:  03/17/22 197 lb (89.4 kg)  02/15/22 197 lb (89.4 kg)  10/05/21 198 lb 3.2 oz (89.9 kg)    General: Alert, oriented, no distress.  Skin: normal turgor, no rashes, warm and dry HEENT: Normocephalic,  atraumatic. Pupils equal round and reactive to light; sclera anicteric; extraocular muscles intact;  Nose  without nasal septal hypertrophy Mouth/Parynx benign; Mallinpatti scale 3 Neck: No JVD, no carotid bruits; normal carotid upstroke Lungs: clear to ausculatation and percussion; no wheezing or rales Chest wall: without tenderness to palpitation Heart: PMI not displaced, RRR, s1 s2 normal, 1/6 systolic murmur at apex, no diastolic murmur, no rubs, gallops, thrills, or heaves Abdomen: soft, nontender; no hepatosplenomehaly, BS+; abdominal aorta nontender and not dilated by palpation. Back: no CVA tenderness Pulses 2+ Musculoskeletal: full range of motion, normal strength, no joint deformities Extremities: no clubbing cyanosis or edema, Homan's sign negative  Neurologic: grossly nonfocal; Cranial nerves grossly wnl Psychologic: Normal mood and affect   Studies/Labs Reviewed:   I personally reviewed the ECG from February 15, 2022: NSR at 11, nonspecific T wave abnormality; QTc 463 msec  October 05, 2021 ECG (independently read by me):  NSR at 62 with mild sinus arrythmia, QTc 420 msec  Jun 29, 2021 ECG (independently read by me): NSR at 63, biatrial enlargement, QTc 440 msec; PR 176 msec  Recent Labs:    Latest Ref Rng & Units 12/12/2021    8:49 AM 07/15/2021    8:29 AM 04/27/2021   11:04 AM  BMP  Glucose 70 - 99 mg/dL 98  90  119   BUN 8 - 27 mg/dL '19  13  16   '$ Creatinine 0.76 - 1.27 mg/dL 0.93  0.82  0.87   BUN/Creat Ratio 10 - '24 20  16    '$ Sodium 134 - 144 mmol/L 142  141  140   Potassium 3.5 - 5.2 mmol/L 5.1  5.0  4.0   Chloride 96 - 106 mmol/L 105  103  105   CO2 20 - 29 mmol/L '27  27  27   '$ Calcium 8.6 - 10.2 mg/dL 10.0  10.0  9.6         Latest Ref Rng & Units 12/12/2021    8:49 AM 07/15/2021    8:29 AM 04/27/2021   11:04 AM  Hepatic Function  Total Protein 6.0 - 8.5 g/dL 6.8  7.0  6.8   Albumin 3.9 - 4.9 g/dL 4.6  4.5  4.4   AST 0 - 40 IU/L '20  14  15   '$ ALT 0 - 44 IU/L '25  19  15   '$ Alk Phosphatase 44 - 121 IU/L 91  98  73   Total Bilirubin 0.0 - 1.2 mg/dL 0.4  0.3  0.4         Latest Ref Rng & Units 07/15/2021    8:29 AM 04/27/2021   11:04 AM 05/25/2009    8:49 AM  CBC  WBC 3.4 - 10.8 x10E3/uL 6.1  6.4  5.2   Hemoglobin 13.0 - 17.7 g/dL 15.1  14.8  14.5   Hematocrit 37.5 - 51.0 % 44.3  43.0  42.0   Platelets 150 - 450 x10E3/uL 348  344.0  313.0    Lab Results  Component Value Date   MCV 92 07/15/2021   MCV 93.1 04/27/2021   MCV 91.1 05/25/2009   Lab Results  Component Value Date   TSH 2.210 07/15/2021   No results found for: "HGBA1C"   BNP No results found for: "BNP"  ProBNP No results found for: "PROBNP"   Lipid Panel     Component Value Date/Time   CHOL 149 03/17/2022 0811   TRIG 67 03/17/2022 0811   HDL 54 03/17/2022 0811  CHOLHDL 2.8 03/17/2022 0811   LDLCALC 82 03/17/2022 0811   LABVLDL 13 03/17/2022 0811     RADIOLOGY: No results found.   Additional studies/ records that were reviewed today include:  I reviewed the records of Dr. Scarlette Shorts as well as [facility].  ECHO: 07/21/2021  1. Left ventricular ejection fraction by 3D volume is 58 %. The left  ventricle has normal function. The left ventricle has no regional wall  motion abnormalities. There is mild left ventricular hypertrophy of the  basal-septal segment. Left ventricular  diastolic parameters are consistent with Grade I diastolic dysfunction  (impaired relaxation).   2. Right ventricular systolic function is normal. The right ventricular  size is normal.   3. The mitral valve is myxomatous. Mild mitral valve regurgitation. No  evidence of mitral stenosis. There is mild holosystolic prolapse of the  middle segment of the anterior leaflet of the mitral valve.   4. The aortic valve is normal in structure. Aortic valve regurgitation is  trivial. No aortic stenosis is present.   5. Pulmonic valve regurgitation is moderate.   6. The inferior vena cava is normal in size with greater than 50%  respiratory variability, suggesting right atrial pressure of 3  mmHg.        Patient Name: Ava, Deguire Date: 09/02/2021 Gender: Male D.O.B: 06-30-1950 Age (years): 67 Referring Provider: Shelva Majestic MD, ABSM Height (inches): 72 Interpreting Physician: Shelva Majestic MD, ABSM Weight (lbs): 192 RPSGT: Jacolyn Reedy BMI: 26 MRN: [id number] Neck Size: 16.00   CLINICAL INFORMATION Sleep Study Type: HST   Indication for sleep study: Snoring, PAF, difficulty sleeping on back   Epworth Sleepiness Score: 7   SLEEP STUDY TECHNIQUE A multi-channel overnight portable sleep study was performed. The channels recorded were: nasal airflow, thoracic respiratory movement, and oxygen saturation with a pulse oximetry. Snoring was also monitored.   MEDICATIONS apixaban (ELIQUIS) 5 MG TABS tablet cholecalciferol (VITAMIN D) 1000 UNITS tablet flecainide (TAMBOCOR) 100 MG tablet inFLIXimab (REMICADE) 100 MG injection metoprolol tartrate (LOPRESSOR) 25 MG tablet Multiple Vitamins-Minerals (MULTIVITAMIN WITH MINERALS) tablet rosuvastatin (CRESTOR) 20 MG tablet Patient self administered medications include: N/A.   SLEEP ARCHITECTURE Patient was studied for 362.9 minutes. The sleep efficiency was 100.0 % and the patient was supine for 0%. The arousal index was 0.0 per hour.   RESPIRATORY PARAMETERS The overall AHI was 13.1 per hour, with a central apnea index of 0 per hour.  There is a significant positional component with supine sleep AHI 29.4/h versus non-supine sleep AHI 3.4/h.   The oxygen nadir was 86% during sleep.   CARDIAC DATA Mean heart rate during sleep was 60.8 bpm.   IMPRESSIONS - Mild obstructive sleep apnea overall occurred during this study (AHI 13.1/h); however, sleep apnea is moderately severe with supine sleep (AHI 29.4/h). - Moderate oxygen desaturation to a nadir of 86%. - Patient snored 8.7% during the sleep.   DIAGNOSIS - Obstructive Sleep Apnea (G47.33)   RECOMMENDATIONS - Therapeutic CPAP titration to determine  optimal pressure required to alleviate sleep disordered breathing. Recommend initiation of Auto-PAP with EPR of 3 at 6 - 16 cm of water. - Effort should be made to optimize nasal and oropharyngeal patency - Positional therapy avoiding supine position during sleep. - If patient is against CPAP initiation alternative therappy with a customized oral appliance can be considered. - Avoid alcohol, sedatives and other CNS depressants that may worsen sleep apnea and disrupt normal sleep architecture. - Sleep hygiene should be reviewed  to assess factors that may improve sleep quality. - Weight management and regular exercise should be initiated or continued. - Recommend a download and sleep clinic evaluation after one month of therapy.    ASSESSMENT:    1. OSA (obstructive sleep apnea)   2. Paroxysmal atrial fibrillation (HCC)   3. Essential hypertension   4. Chronic anticoagulation   5. Pure hypercholesterolemia   6. Mitral valve prolapse   7. Nonrheumatic mitral valve regurgitation     PLAN:  Mr. Pape Parson is a 72 year old gentleman who has a history of hypertension, mitral valve prolapse with mild MR as well as paroxysmal atrial fibrillation and has undergone prior cardioversions in 2000, 2012 and 2017.  He had been on Eliquis in addition to flecainide and metoprolol tartrate.  Subsequently he was only taking flecainide and metoprolol once a day.  He had held his Eliquis to undergo colonoscopy on June 02, 2020 by Dr. Henrene Pastor and subsequently developed episodes of lightheadedness and shortness of breath, was evaluated at [facility] cardiology and was back in atrial fibrillation.  He was advised to take his medicines twice a day and was scheduled for cardioversion.  I saw him for my initial evaluation on Jun 29, 2021 he was in sinus rhythm.  QTc interval was normal.  An echo Doppler study which showed normal LV systolic function with 3D EF at 58%.  He had mild holosystolic prolapse of the middle  segment of the anterior mitral valve leaflet with mild mitral regurgitation.  Both atrium are normal size.  Laboratory revealed significant hyperlipidemia with LDL cholesterol 167, total cholesterol 149.  Triglycerides are 151.  He has recently been started on rosuvastatin therapy following his June laboratory.  Due to my concerns for obstructive sleep apnea a sleep study was done which revealed mild overall sleep apnea but was approaching severe sleep apnea with supine sleep.  He has been contacted by [facility] I last saw him in August 2023 he had not yet received his CPAP device.  During that evaluation I had a lengthy discussion with him today and reviewed the effects of untreated sleep apnea on his cardiovascular health particularly its implications with reference to blood pressure control, nocturnal arrhythmias, increased risk for atrial fibrillation, effects on insulin resistance, increased inflammatory markers, increased GERD, potential for nocturnal hypoxemia contributing to nocturnal ischemia.  He had concerns about wearing a mask.  I am recommending he initiate therapy with a ResMed AirFit N30i fullface mask as necessary then use an F30i.  He received his CPAP device on November 03, 2021.  He met compliance standards on his initial download from October 25 through January 05, 2022 although average use remain still suboptimal at only 4 hours and 31 minutes.  His pressure range was set 26 and 16 cm of water.  Apparently he did not like wearing the full facemask that he had initially been given.  Subsequently, he developed COVID in January and stopped using therapy on February 27, 2022.  Presently, I will change him to a ResMed N30i and provided him with a new sample mask today in the office.  I discussed optimal sleep duration at 7 and 9 hours and the importance of using CPAP through the nights entirety.  We discussed the preponderance of REM sleep occurs in the second half of the night and usually sleep  apnea is more severe with increased hypoxia during REM sleep.  He will resume CPAP tonight with his new mask.  His blood pressure today is  stable and he maintains sinus rhythm on metoprolol tartrate 25 mg twice a day and flecainide 50 mg twice a day.  He is tolerating atorvastatin 20 mg daily.  He is anticoagulated without bleeding on Eliquis 5 mg twice a day.  I will see him in 6 months for reevaluation or sooner as needed.    Medication Adjustments/Labs and Tests Ordered: Current medicines are reviewed at length with the patient today.  Concerns regarding medicines are outlined above.  Medication changes, Labs and Tests ordered today are listed in the Patient Instructions below. Patient Instructions  Medication Instructions:  Your physician recommends that you continue on your current medications as directed. Please refer to the Current Medication list given to you today.  *If you need a refill on your cardiac medications before your next appointment, please call your pharmacy*  Follow-Up: At [facility], you and your health needs are our priority.  As part of our continuing mission to provide you with exceptional heart care, we have created designated Provider Care Teams.  These Care Teams include your primary Cardiologist (physician) and Advanced Practice Providers (APPs -  Physician Assistants and Nurse Practitioners) who all work together to provide you with the care you need, when you need it.  We recommend signing up for the patient portal called "MyChart".  Sign up information is provided on this After Visit Summary.  MyChart is used to connect with patients for Virtual Visits (Telemedicine).  Patients are able to view lab/test results, encounter notes, upcoming appointments, etc.  Non-urgent messages can be sent to your provider as well.   To learn more about what you can do with MyChart, go to [email or web address].    Your next appointment:   6 month(s)  Provider:    Shelva Majestic, MD        Signed, Shelva Majestic, MD  03/18/2022 4:11 PM    [facility] [address] [place], [place], [place]  [address] Phone: [phone]

## 2022-03-17 NOTE — Patient Instructions (Signed)
Medication Instructions:  Your physician recommends that you continue on your current medications as directed. Please refer to the Current Medication list given to you today.  *If you need a refill on your cardiac medications before your next appointment, please call your pharmacy*  Follow-Up: At [facility], you and your health needs are our priority.  As part of our continuing mission to provide you with exceptional heart care, we have created designated Provider Care Teams.  These Care Teams include your primary Cardiologist (physician) and Advanced Practice Providers (APPs -  Physician Assistants and Nurse Practitioners) who all work together to provide you with the care you need, when you need it.  We recommend signing up for the patient portal called "MyChart".  Sign up information is provided on this After Visit Summary.  MyChart is used to connect with patients for Virtual Visits (Telemedicine).  Patients are able to view lab/test results, encounter notes, upcoming appointments, etc.  Non-urgent messages can be sent to your provider as well.   To learn more about what you can do with MyChart, go to [email or web address].    Your next appointment:   6 month(s)  Provider:   Shelva Majestic, MD

## 2022-03-18 ENCOUNTER — Encounter: Payer: Self-pay | Admitting: Cardiovascular Disease

## 2022-03-22 ENCOUNTER — Telehealth: Payer: Self-pay

## 2022-03-22 NOTE — Telephone Encounter (Addendum)
Called patient regarding results. Patient had understandingof results.----- Message from Warren Lacy, PA-C sent at 03/21/2022 10:27 AM EST ----- Cholesterol remains controlled despite med change.  With goal LDL less than 100, HDL over 40 and triglycerides less than 150. Continue Lipitor.

## 2022-04-17 ENCOUNTER — Encounter: Payer: Self-pay | Admitting: Internal Medicine

## 2022-05-18 ENCOUNTER — Encounter: Payer: Self-pay | Admitting: Internal Medicine

## 2022-06-09 ENCOUNTER — Telehealth: Payer: Self-pay | Admitting: Cardiovascular Disease

## 2022-06-09 NOTE — Telephone Encounter (Signed)
Pt states he is trying to get his cpap supplies however they told him the doctor hasn't signed off. Pt states his cpap is leaking, please advise.

## 2022-06-12 NOTE — Telephone Encounter (Signed)
Left message we have not received orders from [employer]. I spoke with Tracie at [place] and she will have the order resent. Advised the patient ot return a call to me. I may have a sample mask that I can provide to him until he gets his supplies from [place].

## 2022-07-06 ENCOUNTER — Ambulatory Visit (INDEPENDENT_AMBULATORY_CARE_PROVIDER_SITE_OTHER): Payer: Medicare Other | Admitting: Gastroenterology

## 2022-07-06 ENCOUNTER — Encounter: Payer: Self-pay | Admitting: Gastroenterology

## 2022-07-06 VITALS — BP 130/70 | HR 76 | Ht 73.0 in | Wt 196.1 lb

## 2022-07-06 DIAGNOSIS — K50119 Crohn's disease of large intestine with unspecified complications: Secondary | ICD-10-CM | POA: Diagnosis not present

## 2022-07-06 NOTE — Patient Instructions (Signed)
Have labs completed at your next Remicade infusion.  Follow up in 1 year or sooner if needed.   _______________________________________________________  If your blood pressure at your visit was 140/90 or greater, please contact your primary care physician to follow up on this.  _______________________________________________________  If you are age 70 or older, your body mass index should be between 23-30. Your Body mass index is 25.88 kg/m. If this is out of the aforementioned range listed, please consider follow up with your Primary Care Provider.  If you are age 27 or younger, your body mass index should be between 19-25. Your Body mass index is 25.88 kg/m. If this is out of the aformentioned range listed, please consider follow up with your Primary Care Provider.   ________________________________________________________  The [facility] providers would like to encourage you to use [facility] to communicate with providers for non-urgent requests or questions.  Due to long hold times on the telephone, sending your provider a message by [facility] may be a faster and more efficient way to get a response.  Please allow 48 business hours for a response.  Please remember that this is for non-urgent requests.  _______________________________________________________

## 2022-07-06 NOTE — Progress Notes (Signed)
07/06/2022 Donald Duncan [id number] 10-14-1950   HISTORY OF PRESENT ILLNESS:  This is a 72 year old male with a past medical history as listed below including A-fib on Eliquis and indeterminate colitis, most likely Crohn's, known to Dr. Marina Goodell, who presents clinic today for follow-up.  He is on Remicade 5 mg/kg every 8 weeks.  Is doing well.  Last colonoscopy April 2023 with normal biopsies throughout.  Had 1 tubular adenoma.  Repeat recommended in 3 years, April 2026.  He has no complaints today.  Having 2-3 formed bowel movements daily.  No rectal bleeding.  No abdominal pain.  Pretty much eats as he wants.  CMP and CBC in January were good.  Past Medical History:  Diagnosis Date   Atrial fibrillation (HCC)    Colon polyp, hyperplastic    Crohn's colitis (HCC)    Diverticulosis    H/O: iron deficiency anemia    Hemorrhoids    Sleep apnea treated with continuous positive airway pressure (CPAP)    Past Surgical History:  Procedure Laterality Date   COLONOSCOPY     FINGER SURGERY     Thumb ; right    reports that he has quit smoking. His smoking use included cigarettes. He has never used smokeless tobacco. He reports current alcohol use. He reports that he does not use drugs. family history includes Alzheimer's disease in his mother; Heart disease in his father and mother; Lung cancer in his father. Allergies  Allergen Reactions   Codeine Itching      Outpatient Encounter Medications as of 07/06/2022  Medication Sig   apixaban (ELIQUIS) 5 MG TABS tablet Take 5 mg by mouth 2 (two) times daily.   atorvastatin (LIPITOR) 20 MG tablet Take 1 tablet (20 mg total) by mouth daily.   cholecalciferol (VITAMIN D) 1000 UNITS tablet Take 1,000 Units by mouth daily.     flecainide (TAMBOCOR) 50 MG tablet Take 1 tablet (50 mg total) by mouth 2 (two) times daily. (Patient taking differently: Take 25 mg by mouth 2 (two) times daily.)   inFLIXimab (REMICADE) 100 MG injection Inject 500 mg  into the vein every 8 (eight) weeks. [facility]   [address]   [address]   [place], [place] [address]   [phone]   metoprolol tartrate (LOPRESSOR) 25 MG tablet TAKE ONE TABLET (25 MG TOTAL) BY MOUTH 2 (TWO) TIMES DAILY.   Multiple Vitamins-Minerals (MULTIVITAMIN WITH MINERALS) tablet Take 1 tablet by mouth daily.   [DISCONTINUED] dabigatran (PRADAXA) 150 MG CAPS Take 150 mg by mouth every 12 (twelve) hours. Twice daily    No facility-administered encounter medications on file as of 07/06/2022.    REVIEW OF SYSTEMS  : All other systems reviewed and negative except where noted in the History of Present Illness.   PHYSICAL EXAM: BP 130/70 (BP Location: Left Arm, Patient Position: Sitting, Cuff Size: Normal)   Pulse 76   Ht 6\' 1"  (1.854 m)   Wt 196 lb 2 oz (89 kg)   BMI 25.88 kg/m  General: Well developed white male in no acute distress Head: Normocephalic and atraumatic Eyes:  Sclerae anicteric, conjunctiva pink. Ears: Normal auditory acuity Lungs: Clear throughout to auscultation; no W/R/R. Heart: Regular rate and rhythm; no M/R/G. Abdomen: Soft, non-distended.  BS present.  Non-tender. Musculoskeletal: Symmetrical with no gross deformities  Skin: No lesions on visible extremities Extremities: No edema  Neurological: Alert oriented x 4, grossly non-focal Psychological:  Alert and cooperative.  Normal mood and affect  ASSESSMENT AND PLAN: 1.  Indeterminate colitis: Most Likely Crohns, Patient remains in deep remission clinically and histologically on Remicade therapy 5 mg/kg every 8 weeks, last colonoscopy 05/2021 with recommendations to repeat in 3 years. 2.  A-fib: On Eliquis   -Continue Remicade. -Check CBC, CMP, quantiferon gold at next infusion. -Follow-up in a year or sooner if needed.  Colonoscopy due again 05/2024.   CC:  No ref. provider found

## 2022-07-06 NOTE — Progress Notes (Signed)
Reviewed. Thank you for seeing this very nice patient

## 2022-07-14 ENCOUNTER — Other Ambulatory Visit: Payer: Self-pay

## 2022-07-14 MED ORDER — APIXABAN 5 MG PO TABS: 5.0000 mg | ORAL_TABLET | Freq: Two times a day (BID) | ORAL | 5 refills | Status: DC

## 2022-07-14 MED ORDER — FLECAINIDE ACETATE 50 MG PO TABS: 50.0000 mg | ORAL_TABLET | Freq: Two times a day (BID) | ORAL | 2 refills | Status: DC

## 2022-07-14 NOTE — Telephone Encounter (Signed)
Prescription refill request for Eliquis received. Indication:afib Last office visit:1/24 Scr:0.82 Age: 72 Weight:89  kg  Prescription refilled

## 2022-07-28 ENCOUNTER — Telehealth: Payer: Self-pay | Admitting: Cardiovascular Disease

## 2022-07-28 NOTE — Telephone Encounter (Signed)
Pt would like a callback regarding sleep supplies. Please advise 

## 2022-09-05 ENCOUNTER — Other Ambulatory Visit: Payer: Self-pay | Admitting: Cardiovascular Disease

## 2022-09-11 NOTE — Progress Notes (Unsigned)
  Cardiology Office Note   Date:  09/13/2022  ID:  Donald Duncan, Donald Duncan 1950-04-23, MRN [id number] PCP:  Patient, No Pcp Per [facility] Cardiologist: Nicki Guadalajara, MD  Reason for visit: 48-month follow-up  History of Present Illness    Donald Duncan is a 72 y.o. male with a hx of atrial fibrillation, ulcerative colitis on Remicade since 2002, mitral valve prolapse and mitral insufficiency, hyperlipidemia, sleep apnea followed by Dr. Tresa Endo.   I last saw him in January 2024.  He had issues tolerating his CPAP mask, feeling claustrophobic and dealing with itching.  Patient denied his typical A-fib symptoms of fatigue and dyspnea exertion.  No palpitations  Today, patient feels well.  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  He states no A-fib symptoms.  He denies chest pain, shortness of breath, syncope, orthopnea, PND or significant pedal edema.  No palpitations.  Patient does ask about his atorvastatin.  He thinks his joint/muscle pain may be related to statins.  Patient states since her last visit, he tried a nasal CPAP but this did not work.  He is back with his mask using approximately 6 hours per night.  Objective / Physical Exam   EKG today: Sinus bradycardia, heart rate 55.  Normal QRS  Vital signs:  BP 130/78 (BP Location: Left Arm, Patient Position: Sitting, Cuff Size: Normal)   Pulse (!) 55   Ht 6\' 2"  (1.88 m)   Wt 196 lb 12.8 oz (89.3 kg)   SpO2 96%   BMI 25.27 kg/m     GEN: No acute distress NECK: No carotid bruits CARDIAC: RRR, no murmurs RESPIRATORY:  Clear to auscultation without rales, wheezing or rhonchi  EXTREMITIES: No edema  Assessment and Plan   Paroxysmal atrial fibrillation Chronic Anticoagulation Flecainide monitoring -Cardioversions in 2000, 2012 and 2017 -Last known A-fib event in 2022 in the setting of colonoscopy, self converted -A-fib symptoms include fatigue and dyspnea exertion -Echo in June 2023 with normal EF, mild  LVH, mild MR, mild mitral valve prolapse, normal left atrial size. -EKG today with sinus bradycardia, heart rate 55. -Continue flecainide 50 mg twice daily and metoprolol tartrate 25 mg twice daily. -Check renal function and liver function today.  Monitor CMET and EKG every 6 months while taking flecainide. -Continue Eliquis 5 mg twice daily for stroke prevention   Hyperlipidemia well controlled -History of GI upset with Crestor -Lipids December 12, 2021 with total cholesterol 128, HDL 54, LDL 59 and trig 73 (LDL prior to meds 167 in 07/2021) -Patient is concerned about possible myalgias from Lipitor 20 mg daily.  Recommend 2-week holiday off Lipitor.  If no difference in myalgias, restart Lipitor at current dose.  If he does feel like joint/muscle pain is better off Lipitor, please contact our office for further instructions.  Sleep apnea -Wearing CPAP 6 hours per night.  Has follow-up with Dr. Tresa Endo for sleep apnea management in 3 months.      Disposition - Follow-up in 6 months for flecainide monitoring.   Signed, Cannon Kettle, PA-C  09/13/2022 [facility]

## 2022-09-13 ENCOUNTER — Encounter: Payer: Self-pay | Admitting: Physician Assistant

## 2022-09-13 ENCOUNTER — Ambulatory Visit: Payer: Medicare Other | Attending: Physician Assistant | Admitting: Physician Assistant

## 2022-09-13 VITALS — BP 130/78 | HR 55 | Ht 74.0 in | Wt 196.8 lb

## 2022-09-13 DIAGNOSIS — I480 Paroxysmal atrial fibrillation: Secondary | ICD-10-CM

## 2022-09-13 DIAGNOSIS — G4733 Obstructive sleep apnea (adult) (pediatric): Secondary | ICD-10-CM

## 2022-09-13 DIAGNOSIS — E7800 Pure hypercholesterolemia, unspecified: Secondary | ICD-10-CM | POA: Diagnosis present

## 2022-09-13 DIAGNOSIS — I10 Essential (primary) hypertension: Secondary | ICD-10-CM

## 2022-09-13 NOTE — Patient Instructions (Signed)
Medication Instructions:  No Changes *If you need a refill on your cardiac medications before your next appointment, please call your pharmacy*   Lab Work: CMET Today If you have labs (blood work) drawn today and your tests are completely normal, you will receive your results only by: MyChart Message (if you have MyChart) OR A paper copy in the mail If you have any lab test that is abnormal or we need to change your treatment, we will call you to review the results.   Testing/Procedures: No Testing   Follow-Up: At [facility], you and your health needs are our priority.  As part of our continuing mission to provide you with exceptional heart care, we have created designated Provider Care Teams.  These Care Teams include your primary Cardiologist (physician) and Advanced Practice Providers (APPs -  Physician Assistants and Nurse Practitioners) who all work together to provide you with the care you need, when you need it.  We recommend signing up for the patient portal called "MyChart".  Sign up information is provided on this After Visit Summary.  MyChart is used to connect with patients for Virtual Visits (Telemedicine).  Patients are able to view lab/test results, encounter notes, upcoming appointments, etc.  Non-urgent messages can be sent to your provider as well.   To learn more about what you can do with MyChart, go to [email or web address].    Your next appointment:   6 month(s)  Provider:   Juanda Crumble, PA-C    or, Nicki Guadalajara, MD    Other Instructions Recommend stopping Lipitor for 2 weeks.  If no improvement restart. If muscle pain improves call office.

## 2022-09-20 ENCOUNTER — Telehealth: Payer: Self-pay

## 2022-09-20 NOTE — Telephone Encounter (Addendum)
Called patient regarding results. Left detailed message for patient regarding results. Letter mailed 09/20/22.----- Message from Cannon Kettle sent at 09/19/2022  1:53 PM EDT ----- Normal kidney function and liver function. Safe to continue flecainide at current dose.

## 2022-12-07 ENCOUNTER — Ambulatory Visit: Payer: Medicare Other | Attending: Cardiovascular Disease | Admitting: Cardiovascular Disease

## 2022-12-07 ENCOUNTER — Encounter: Payer: Self-pay | Admitting: Cardiovascular Disease

## 2022-12-07 DIAGNOSIS — E7800 Pure hypercholesterolemia, unspecified: Secondary | ICD-10-CM

## 2022-12-07 DIAGNOSIS — I10 Essential (primary) hypertension: Secondary | ICD-10-CM | POA: Diagnosis present

## 2022-12-07 DIAGNOSIS — G4733 Obstructive sleep apnea (adult) (pediatric): Secondary | ICD-10-CM | POA: Diagnosis present

## 2022-12-07 DIAGNOSIS — I480 Paroxysmal atrial fibrillation: Secondary | ICD-10-CM

## 2022-12-07 DIAGNOSIS — Z7901 Long term (current) use of anticoagulants: Secondary | ICD-10-CM | POA: Diagnosis not present

## 2022-12-07 DIAGNOSIS — I340 Nonrheumatic mitral (valve) insufficiency: Secondary | ICD-10-CM

## 2022-12-07 DIAGNOSIS — I341 Nonrheumatic mitral (valve) prolapse: Secondary | ICD-10-CM

## 2022-12-07 NOTE — Patient Instructions (Signed)
  Follow-Up: At [facility], you and your health needs are our priority.  As part of our continuing mission to provide you with exceptional heart care, we have created designated Provider Care Teams.  These Care Teams include your primary Cardiologist (physician) and Advanced Practice Providers (APPs -  Physician Assistants and Nurse Practitioners) who all work together to provide you with the care you need, when you need it.  We recommend signing up for the patient portal called "MyChart".  Sign up information is provided on this After Visit Summary.  MyChart is used to connect with patients for Virtual Visits (Telemedicine).  Patients are able to view lab/test results, encounter notes, upcoming appointments, etc.  Non-urgent messages can be sent to your provider as well.   To learn more about what you can do with MyChart, go to [email or web address].    Your next appointment:   As needed for sleep care   You may call Ashley Mariner at [phone] for sleep care concerns.  Provider:   Nicki Guadalajara, MD

## 2022-12-07 NOTE — Progress Notes (Signed)
Cardiology Office Note    Date:  12/15/2022   ID:  Donald Duncan, DOB 1950/10/03, MRN [id number]  PCP:  Patient, No Pcp Per  Cardiologist:  Nicki Guadalajara, MD   8 month follow-up cardiology/sleep evaluation   History of Present Illness:  Donald Duncan is a 72 y.o. male who is a former patient of Dr. Miguel Rota who had recently retired from [employer] in December 2022.  [name] has a history of initial episode of atrial fibrillation in 2000 with subsequent recurrent atrial fibrillation.  2012 and possibly 2017 treated by Dr. Wendie Simmer.  He had been maintaining sinus rhythm.  He has a history of colitis, possibly to be Crohn's disease followed by Dr. Yancey Flemings and has been treated with Remicade.  He was scheduled to undergo colonoscopy on June 02, 2021 and stopped taking Eliquis on May 29, 2021 in preparation.  Eliquis was restarted following the procedure and later that day the patient felt weak noted shortness of breath and was back in atrial fibrillation.  Of note, the patient had previously only been taking Eliquis once a day in addition to flecainide and metoprolol once a day rather than the scheduled twice daily regimen.  He was seen by Antony Haste, NP on June 07, 2021 and was scheduled to undergo cardioversion on June 08, 2021 with Dr. Ivan Croft.  He has a reported history of mitral valve prolapse with mild mitral insufficiency  Presently, the patient admits to a normal rhythm.  He has desired to transfer care to [facility] health since Dr. Wendie Simmer had retired and he sees Dr. Marina Goodell.  presents for new patient evaluation with me.  When I initially saw him on Jun 29, 2021 he denied any recent chest pain.  He felt that he sleeps well well however he admits that he snores when on his back and therefore typically sleeps on his side.  He goes to bed between 9:30 and 10 PM and wakes up at 5 AM he tries to average greater than 7 hours of sleep per night.  He admits to nocturia x1.  At times he  admits to nocturnal palpitations.  During that evaluation, he was maintaining sinus rhythm ECG suggested biatrial enlargement.  He had normal QT interval.  He subsequently underwent a 2D echo Doppler study which was done on July 21, 2021.  Left ventricular ejection fraction by 3D volume was 58%.  There was mild LVH of the basal septal segment.  There was mild grade 1 diastolic dysfunction.  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  Both atrium were normal in size.  Aortic valve was normal.  Due to my concerns with obstructive sleep apnea with significant difficulty when he lies on his back with his history of atrial fibrillation I recommended he undergo a sleep study.  On September 02, 2021 he underwent a home sleep study.  He was found to have overall mild sleep apnea with an AHI of 13.1/h.  However there was a significant positional component with supine sleep AHI at 29.4/h.  Oxygen desaturated to a nadir of 86%.  He snored for 8.7% of sleep.  AutoPap therapy was recommended at an EPR of 3 with a pressure range of 6 to 16 cm of water.  I saw him for follow-up evaluation on October 05, 2021.  He had undergone laboratory July 15, 2021.  Lipid  studies were significantly elevated with total cholesterol 249, HDL 55, LDL 167, and triglycerides 151.  Creatinine was stable at 0.82.  He was started on rosuvastatin 20 mg.  He was recently by [employer] which will be his DME company.  He is still awaiting his rResMed AirSense 11 AutoSet unit.  He had several questions in the office today regarding his sleep study and need for CPAP.  He denied any chest pain.  He was unaware of recurrent A-fib.  He continues to be on Eliquis 5 mg twice a day, flecainide 50 mg twice a day in addition to metoprolol to tartrate 25 mg twice a day.  Maintaining sinus rhythm.  He also is on Remicade every 8 weeks for his ulcerative colitis.  He is  tolerating rosuvastatin 20 mg since initiating therapy.    He received a new ResMed AirSense 11 AutoSet unit on November 03, 2021.  Initial download from October 25 through January 05, 2022 was consistent with meeting compliance with 100% use.  He just met compliance with usage greater than 4 hours at 70%.  His average use was 4 hours and 31 minutes/day.  His unit was set at a pressure range of 6 to 16 cm of water and his 95th percentile pressure was 8.7 with maximum average pressure 9.4.  AHI was 1.5.  In January 2024 he developed COVID.  As result, he stopped using therapy on January 14 and had not yet resumed treatment.  He recently has tested negative for COVID.  He saw Juanda Crumble on February 15, 2022 and was maintaining sinus rhythm on flecainide for rhythm control and metoprolol for rate control.  He continues to be on Eliquis for stroke prevention.    I last saw him on March 17, 2022. His most recent lipid studies in October 2023 showed a total cholesterol 128, HDL 54, LDL 59, and triglycerides 73.  There was marked benefit from initial LDL prior to treatment at 829 and he had recently been switched from Crestor to atorvastatin 20 mg.  He is unaware of any recurrent A-fib.  During that evaluation, he told me that he developed COVID in January 2024 and stopped using therapy.  I stressed the importance of trying to resume CPAP and I provided him with a new ResMed N30i sample mask which I believe he would like better than his previous fullface mask.  Presently, [name] feels well.  He continues to be on Eliquis for anticoagulation.  He is on flecainide 50 mg twice a day and metoprolol tartrate 25 mg twice a day as well as Remicade.  He has resumed CPAP use and his download from September 22 through December 04, 2022 shows excellent usage compliance at 97% with only 1 missed day.  He is compliant with average use of 5 hours and 11 minutes but I stressed with him the importance of optimal sleep  duration at 7 and 9 hours with CPAP through the nights entirety.  His pressure is set at a range of 6 to 16 cm and AHI 0.5 with his 95th percentile pressure at 9.7 and maximum average pressure 10.6.  He presents for evaluation.  Past Medical History:  Diagnosis Date   Atrial fibrillation (HCC)    Colon polyp, hyperplastic    Crohn's colitis (HCC)    Diverticulosis    H/O: iron deficiency anemia    Hemorrhoids    Sleep apnea treated with continuous positive airway pressure (CPAP)     Past Surgical History:  Procedure Laterality Date   COLONOSCOPY     FINGER SURGERY     Thumb ; right    Current Medications: Outpatient Medications Prior to Visit  Medication Sig Dispense Refill   apixaban (ELIQUIS) 5 MG TABS tablet Take 1 tablet (5 mg total) by mouth 2 (two) times daily. 60 tablet 5   atorvastatin (LIPITOR) 20 MG tablet TAKE 1 TABLET BY MOUTH EVERY DAY 30 tablet 6   cholecalciferol (VITAMIN D) 1000 UNITS tablet Take 1,000 Units by mouth daily.       flecainide (TAMBOCOR) 50 MG tablet Take 1 tablet (50 mg total) by mouth 2 (two) times daily. 180 tablet 2   inFLIXimab (REMICADE) 100 MG injection Inject 500 mg into the vein every 8 (eight) weeks. [facility]   [address]   [address]   [place], [place] [address]   [phone] 1 each 11   metoprolol tartrate (LOPRESSOR) 25 MG tablet TAKE ONE TABLET (25 MG TOTAL) BY MOUTH 2 (TWO) TIMES DAILY.     Multiple Vitamins-Minerals (MULTIVITAMIN WITH MINERALS) tablet Take 1 tablet by mouth daily.     No facility-administered medications prior to visit.     Allergies:   Codeine   Social History   Socioeconomic History   Marital status: Married    Spouse name: Not on file   Number of children: Not on file   Years of education: Not on file   Highest education level: Not on file  Occupational History   Occupation: self employed  Tobacco Use   Smoking status: Former    Types: Cigarettes    Smokeless tobacco: Never   Tobacco comments:    Quit in 2000  Vaping Use   Vaping status: Never Used  Substance and Sexual Activity   Alcohol use: Yes    Comment: Rarely once a year.   Drug use: No   Sexual activity: Not on file  Other Topics Concern   Not on file  Social History Narrative   Daily caffeine   Social Determinants of Health   Financial Resource Strain: Unknown (01/25/2021)   Received from [facility], [facility]   Overall Financial Resource Strain (CARDIA)    Difficulty of Paying Living Expenses: Patient declined  Food Insecurity: No Food Insecurity (06/06/2021)   Received from [facility], [facility]   Hunger Vital Sign    Worried About Running Out of Food in the Last Year: Never true    Ran Out of Food in the Last Year: Never true  Transportation Needs: No Transportation Needs (01/25/2021)   Received from [facility], [facility]   [facility] - Transportation    Lack of Transportation (Medical): No    Lack of Transportation (Non-Medical): No  Physical Activity: Sufficiently Active (01/25/2021)   Received from [facility], [facility]   Exercise Vital Sign    Days of Exercise per Week: 6 days    Minutes of Exercise per Session: 60 min  Stress: Stress Concern Present (01/25/2021)   Received from [facility], [facility] - Occupational Stress Questionnaire    Feeling of Stress : To some extent  Social Connections: Unknown (06/24/2021)   Received from [facility], [facility]   Social Network    Social Network: Not on file    Social history is notable that he was born in [place].  He is married for 25 years and has 1 child.  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  There is remote tobacco history of smoking for 20 years but he quit smoking in 2001.  He drinks occasional beer.  He tries to exercise 5 days/week.  Family History:  The patient's family history includes Alzheimer's disease in  his mother; Heart disease in his father and mother; Lung cancer in his father.  His mother has Alzheimer's disease and is 30.  Father has lung cancer and was a smoker.  He has a half brother and 2 sisters.  He has a daughter age 39.  ROS General: Negative; No fevers, chills, or night sweats;  HEENT: Negative; No changes in vision or hearing, sinus congestion, difficulty swallowing Pulmonary: Negative; No cough, wheezing, shortness of breath, hemoptysis Cardiovascular: See HPI GI: Colitis, possible Crohn's on Remicade followed by Dr. Yancey Flemings GU: Negative; No dysuria, hematuria, or difficulty voiding Musculoskeletal: Negative; no myalgias, joint pain, or weakness Hematologic/Oncology: Negative; no easy bruising, bleeding Endocrine: Negative; no heat/cold intolerance; no diabetes Neuro: Negative; no changes in balance, headaches Skin: Negative; No rashes or skin lesions Psychiatric: Negative; No behavioral problems, depression Sleep: Positive for snoring when sleeping supine.  He typically sleeps for approximately 7 hours.  Occasional nocturnal palpitations.  Nocturia x1.   He was started on CPAP therapy with set up date November 03, 2021.  With treatment, he is unaware of breakthrough snoring.  His sleep is more restorative.  He denies daytime sleepiness. An Epworth Sleepiness Scale score was recalculated in the office today and this endorsed at 6.  Other comprehensive 14 point system review is negative.   PHYSICAL EXAM:   VS:  BP 116/64   Pulse 68   Ht 6\' 2"  (1.88 m)   Wt 198 lb (89.8 kg)   SpO2 96%   BMI 25.42 kg/m     Repeat blood pressure by me was 120/68  Wt Readings from Last 3 Encounters:  12/07/22 198 lb (89.8 kg)  09/13/22 196 lb 12.8 oz (89.3 kg)  07/06/22 196 lb 2 oz (89 kg)    General: Alert, oriented, no distress.  Skin: normal turgor, no rashes, warm and dry HEENT: Normocephalic, atraumatic. Pupils equal round and reactive to light; sclera anicteric;  extraocular muscles intact;  Nose without nasal septal hypertrophy Mouth/Parynx benign; Mallinpatti scale 3 Neck: No JVD, no carotid bruits; normal carotid upstroke Lungs: clear to ausculatation and percussion; no wheezing or rales Chest wall: without tenderness to palpitation Heart: PMI not displaced, RRR, s1 s2 normal, 1/6 systolic murmur, no diastolic murmur, no rubs, gallops, thrills, or heaves Abdomen: soft, nontender; no hepatosplenomehaly, BS+; abdominal aorta nontender and not dilated by palpation. Back: no CVA tenderness Pulses 2+ Musculoskeletal: full range of motion, normal strength, no joint deformities Extremities: no clubbing cyanosis or edema, Homan's sign negative  Neurologic: grossly nonfocal; Cranial nerves grossly wnl Psychologic: Normal mood and affect    General: Alert, oriented, no distress.  Skin: normal turgor, no rashes, warm and dry HEENT: Normocephalic, atraumatic. Pupils equal round and reactive to light; sclera anicteric; extraocular muscles intact;  Nose without nasal septal hypertrophy Mouth/Parynx benign; Mallinpatti scale 3 Neck: No JVD, no carotid bruits; normal carotid upstroke Lungs: clear to ausculatation and percussion; no wheezing or rales Chest wall: without tenderness to palpitation Heart: PMI not displaced, RRR, s1 s2 normal, 1/6 systolic murmur at apex, no diastolic murmur, no rubs, gallops, thrills, or heaves Abdomen: soft, nontender; no hepatosplenomehaly, BS+; abdominal aorta nontender and not dilated by palpation. Back: no CVA tenderness Pulses 2+ Musculoskeletal: full  range of motion, normal strength, no joint deformities Extremities: no clubbing cyanosis or edema, Homan's sign negative  Neurologic: grossly nonfocal; Cranial nerves grossly wnl Psychologic: Normal mood and affect   Studies/Labs Reviewed:   EKG Interpretation Date/Time:  Thursday December 07 2022 09:30:19 EDT Ventricular Rate:  68 PR Interval:  186 QRS  Duration:  72 QT Interval:  408 QTC Calculation: 433 R Axis:   -3  Text Interpretation: Normal sinus rhythm Biatrial enlargement When compared with ECG of 13-Sep-2022 10:17, No significant change was found Confirmed by Nicki Guadalajara ([id number]) on 12/07/2022 10:16:40 AM      I personally reviewed the ECG from February 15, 2022: NSR at 77, nonspecific T wave abnormality; QTc 463 msec  October 05, 2021 ECG (independently read by me):  NSR at 62 with mild sinus arrythmia, QTc 420 msec  Jun 29, 2021 ECG (independently read by me): NSR at 63, biatrial enlargement, QTc 440 msec; PR 176 msec  Recent Labs:    Latest Ref Rng & Units 09/13/2022   11:00 AM 12/12/2021    8:49 AM 07/15/2021    8:29 AM  BMP  Glucose 70 - 99 mg/dL 97  98  90   BUN 8 - 27 mg/dL 15  19  13    Creatinine 0.76 - 1.27 mg/dL 2.95  6.21  3.08   BUN/Creat Ratio 10 - 24 18  20  16    Sodium 134 - 144 mmol/L 141  142  141   Potassium 3.5 - 5.2 mmol/L 4.6  5.1  5.0   Chloride 96 - 106 mmol/L 102  105  103   CO2 20 - 29 mmol/L 26  27  27    Calcium 8.6 - 10.2 mg/dL 9.5  65.7  84.6         Latest Ref Rng & Units 09/13/2022   11:00 AM 12/12/2021    8:49 AM 07/15/2021    8:29 AM  Hepatic Function  Total Protein 6.0 - 8.5 g/dL 6.9  6.8  7.0   Albumin 3.9 - 4.9 g/dL 4.7  4.6  4.5   AST 0 - 40 IU/L 26  20  14    ALT 0 - 44 IU/L 25  25  19    Alk Phosphatase 44 - 121 IU/L 95  91  98   Total Bilirubin 0.0 - 1.2 mg/dL 0.6  0.4  0.3        Latest Ref Rng & Units 07/15/2021    8:29 AM 04/27/2021   11:04 AM 05/25/2009    8:49 AM  CBC  WBC 3.4 - 10.8 x10E3/uL 6.1  6.4  5.2   Hemoglobin 13.0 - 17.7 g/dL 96.2  95.2  84.1   Hematocrit 37.5 - 51.0 % 44.3  43.0  42.0   Platelets 150 - 450 x10E3/uL 348  344.0  313.0    Lab Results  Component Value Date   MCV 92 07/15/2021   MCV 93.1 04/27/2021   MCV 91.1 05/25/2009   Lab Results  Component Value Date   TSH 2.210 07/15/2021   No results found for: "HGBA1C"   BNP No results found  for: "BNP"  ProBNP No results found for: "PROBNP"   Lipid Panel     Component Value Date/Time   CHOL 149 03/17/2022 0811   TRIG 67 03/17/2022 0811   HDL 54 03/17/2022 0811   CHOLHDL 2.8 03/17/2022 0811   LDLCALC 82 03/17/2022 0811   LABVLDL 13 03/17/2022 0811     RADIOLOGY: No results  found.   Additional studies/ records that were reviewed today include:  I reviewed the records of Dr. Yancey Flemings as well as [facility].  ECHO: 07/21/2021  1. Left ventricular ejection fraction by 3D volume is 58 %. The left  ventricle has normal function. The left ventricle has no regional wall  motion abnormalities. There is mild left ventricular hypertrophy of the  basal-septal segment. Left ventricular  diastolic parameters are consistent with Grade I diastolic dysfunction  (impaired relaxation).   2. Right ventricular systolic function is normal. The right ventricular  size is normal.   3. The mitral valve is myxomatous. Mild mitral valve regurgitation. No  evidence of mitral stenosis. There is mild holosystolic prolapse of the  middle segment of the anterior leaflet of the mitral valve.   4. The aortic valve is normal in structure. Aortic valve regurgitation is  trivial. No aortic stenosis is present.   5. Pulmonic valve regurgitation is moderate.   6. The inferior vena cava is normal in size with greater than 50%  respiratory variability, suggesting right atrial pressure of 3 mmHg.        Patient Name: Donald Duncan, Donald Duncan Date: 09/02/2021 Gender: Male D.O.B: [birth year] Age (years): 43 Referring Provider: Nicki Guadalajara MD, ABSM Height (inches): 72 Interpreting Physician: Nicki Guadalajara MD, ABSM Weight (lbs): 192 RPSGT: [place] BMI: 26 MRN: [id number] Neck Size: 16.00   CLINICAL INFORMATION Sleep Study Type: HST   Indication for sleep study: Snoring, PAF, difficulty sleeping on back   Epworth Sleepiness Score: 7   SLEEP STUDY TECHNIQUE A multi-channel  overnight portable sleep study was performed. The channels recorded were: nasal airflow, thoracic respiratory movement, and oxygen saturation with a pulse oximetry. Snoring was also monitored.   MEDICATIONS apixaban (ELIQUIS) 5 MG TABS tablet cholecalciferol (VITAMIN D) 1000 UNITS tablet flecainide (TAMBOCOR) 100 MG tablet inFLIXimab (REMICADE) 100 MG injection metoprolol tartrate (LOPRESSOR) 25 MG tablet Multiple Vitamins-Minerals (MULTIVITAMIN WITH MINERALS) tablet rosuvastatin (CRESTOR) 20 MG tablet Patient self administered medications include: N/A.   SLEEP ARCHITECTURE Patient was studied for 362.9 minutes. The sleep efficiency was 100.0 % and the patient was supine for 0%. The arousal index was 0.0 per hour.   RESPIRATORY PARAMETERS The overall AHI was 13.1 per hour, with a central apnea index of 0 per hour.  There is a significant positional component with supine sleep AHI 29.4/h versus non-supine sleep AHI 3.4/h.   The oxygen nadir was 86% during sleep.   CARDIAC DATA Mean heart rate during sleep was 60.8 bpm.   IMPRESSIONS - Mild obstructive sleep apnea overall occurred during this study (AHI 13.1/h); however, sleep apnea is moderately severe with supine sleep (AHI 29.4/h). - Moderate oxygen desaturation to a nadir of 86%. - Patient snored 8.7% during the sleep.   DIAGNOSIS - Obstructive Sleep Apnea (G47.33)   RECOMMENDATIONS - Therapeutic CPAP titration to determine optimal pressure required to alleviate sleep disordered breathing. Recommend initiation of Auto-PAP with EPR of 3 at 6 - 16 cm of water. - Effort should be made to optimize nasal and oropharyngeal patency - Positional therapy avoiding supine position during sleep. - If patient is against CPAP initiation alternative therappy with a customized oral appliance can be considered. - Avoid alcohol, sedatives and other CNS depressants that may worsen sleep apnea and disrupt normal sleep architecture. - Sleep hygiene  should be reviewed to assess factors that may improve sleep quality. - Weight management and regular exercise should be initiated or continued. - Recommend a download  and sleep clinic evaluation after one month of therapy.    ASSESSMENT:    1. OSA (obstructive sleep apnea) on CPAP   2. Paroxysmal atrial fibrillation (HCC)   3. Essential hypertension   4. Chronic anticoagulation   5. Mitral valve prolapse   6. Pure hypercholesterolemia   7. Nonrheumatic mitral valve regurgitation     PLAN:  Mr. Axxel Gude is a 72 year-old gentleman who has a history of hypertension, mitral valve prolapse with mild MR as well as paroxysmal atrial fibrillation and has undergone prior cardioversions in 2000, 2012 and 2017.  He had been on Eliquis in addition to flecainide and metoprolol tartrate.  Subsequently he was only taking flecainide and metoprolol once a day.  He had held his Eliquis to undergo colonoscopy on June 02, 2020 by Dr. Marina Goodell and subsequently developed episodes of lightheadedness and shortness of breath, was evaluated at [facility] cardiology and was back in atrial fibrillation.  He was advised to take his medicines twice a day and was scheduled for cardioversion.  I saw him for my initial evaluation on Jun 29, 2021 he was in sinus rhythm.  QTc interval was normal.  An echo Doppler study showed normal LV systolic function with 3D EF at 58%.  He had mild holosystolic prolapse of the middle segment of the anterior mitral valve leaflet with mild mitral regurgitation.  Both atrium are normal size.  Laboratory revealed significant hyperlipidemia with LDL cholesterol 167, total cholesterol 149.  Triglycerides are 151.  He was started on rosuvastatin therapy following his June 2023  laboratory currently is on atorvastatin 20 mg daily.  He continues to be in sinus rhythm on flecainide and metoprolol and is anticoagulated with Eliquis.  He has been on CPAP therapy since his set up date on January 03, 2022 with [employer] as his DME company.  At his previous office visit, I recommended he change to an N30i fullface mask.  Reviewed data regarding adverse consequences of untreated sleep apnea with reference to his cardiovascular health as well outlined in my prior note.  His most recent download now confirms excellent usage compliance.  However, although he is compliant with average use at 5 hours and 11 minutes, I stressed with him the importance of optimal sleep duration at 7 and 9 hours.  At his current CPAP pressure setting range of 6-16, AHI is excellent at 0.5 and his 95th percentile average pressure is 9.7 with maximum average pressure of 10.6.  He has had some issues with his mouth getting dry and I recommended he increase his humidification to see if this will improve his mouth dryness.  I will change his medication setting from a auto to a manual mode and change him to a level 5 to see if this improves his dryness.  Clinically he is doing well.    Medication Adjustments/Labs and Tests Ordered: Current medicines are reviewed at length with the patient today.  Concerns regarding medicines are outlined above.  Medication changes, Labs and Tests ordered today are listed in the Patient Instructions below. Patient Instructions   Follow-Up: At [facility], you and your health needs are our priority.  As part of our continuing mission to provide you with exceptional heart care, we have created designated Provider Care Teams.  These Care Teams include your primary Cardiologist (physician) and Advanced Practice Providers (APPs -  Physician Assistants and Nurse Practitioners) who all work together to provide you with the care you need, when you need  it.  We recommend signing up for the patient portal called "MyChart".  Sign up information is provided on this After Visit Summary.  MyChart is used to connect with patients for Virtual Visits (Telemedicine).  Patients are able to view lab/test results,  encounter notes, upcoming appointments, etc.  Non-urgent messages can be sent to your provider as well.   To learn more about what you can do with MyChart, go to [email or web address].    Your next appointment:   As needed for sleep care   You may call Ashley Mariner at [phone] for sleep care concerns.  Provider:   Nicki Guadalajara, MD        Signed, Nicki Guadalajara, MD, [facility], ABSM Diplomate, American Board of Sleep Medicine  12/15/2022 2:26 PM    [facility] [address], [address], [place], [place]  [address] Phone: [phone]

## 2022-12-15 ENCOUNTER — Encounter: Payer: Self-pay | Admitting: Cardiovascular Disease

## 2023-01-15 ENCOUNTER — Other Ambulatory Visit: Payer: Self-pay | Admitting: Cardiovascular Disease

## 2023-01-15 NOTE — Telephone Encounter (Signed)
Prescription refill request for Eliquis received. Indication:afib Last office visit:10/24 Scr:0.85   7/24 Age: 72 Weight:89.8  kg  Prescription refilled

## 2023-03-01 ENCOUNTER — Telehealth: Payer: Self-pay | Admitting: Cardiovascular Disease

## 2023-03-01 NOTE — Telephone Encounter (Signed)
Called patient with no answer. Left VM to return call.

## 2023-03-01 NOTE — Telephone Encounter (Signed)
Pt c/o medication issue:  1. Name of Medication:   ELIQUIS 5 MG TABS tablet   2. How are you currently taking this medication (dosage and times per day)?   As prescribed  3. Are you having a reaction (difficulty breathing--STAT)?   No  4. What is your medication issue?   Patient stated this medication is too expensive and wants to get alternate medication that's less expensive or next steps.

## 2023-03-01 NOTE — Telephone Encounter (Signed)
Patient identification verified by 2 forms. Marilynn Rail, RN    Called and spoke to patient  Patient states:   -cost of Rx is [phone]  -unsure what cost of deductible is   -would like to know if there are cheaper medication   -he was previously on warfarin, is aware of follow up appointments for that  Informed patient:   -cost likely includes deductible   -once deductible met, price should decrease   -even if different medication prescribed deductible would still need to be met   -contact insurance to review plan/deductible   -could speak to insurance about payment option for deductible (if applicable)   -message sent to pharmacy regarding cheaper options  Patient verbalized understanding, will contact insurance

## 2023-03-01 NOTE — Telephone Encounter (Signed)
Follow Up:      Patient is returning call.

## 2023-03-02 ENCOUNTER — Encounter: Payer: Self-pay | Admitting: Pharmacist

## 2023-03-02 NOTE — Telephone Encounter (Signed)
Patient identification verified by 2 forms. Marilynn Rail, RN    Called and spoke to patient  Patient states:   -cost of deductible is ~$590   -per insurance first month Rx will cost ~$567, 2nd month ~$390, 3rd month ~$270   -unsure what the actual of Eliquis is   -cost will of medication will decide which medication he takes  Advised patient:   -contact insurance to confirm cost of eliquis after deductible is met   -confirm cost of warfarin after deductible met   -confirm cost of future copay for lab/OV if were to switch to warfarin  -contact office with update, and decision  Patient verbalized understanding, no questions at this time

## 2023-03-16 ENCOUNTER — Other Ambulatory Visit: Payer: Self-pay | Admitting: Cardiovascular Disease

## 2023-03-16 NOTE — Telephone Encounter (Signed)
Pt is requesting a refill on metoprolol. This medication has not been refilled since 2017 and Dr. Tresa Endo did not prescribe this medication. Would Dr. Tresa Endo like to refill this medication? Please address

## 2023-03-16 NOTE — Telephone Encounter (Signed)
*  STAT* If patient is at the pharmacy, call can be transferred to refill team.   1. Which medications need to be refilled? (please list name of each medication and dose if known)   metoprolol tartrate (LOPRESSOR) 25 MG tablet   Pt states he has been taking 50mg  cut in half twice daily.   4. Which pharmacy/location (including street and city if local pharmacy) is medication to be sent to? [facility] [phone] - [place], [place] - [address] Phone: [phone]  Fax: [phone]       5. Do they need a 30 day or 90 day supply? 90

## 2023-03-20 ENCOUNTER — Other Ambulatory Visit (HOSPITAL_COMMUNITY): Payer: Self-pay

## 2023-03-20 MED ORDER — METOPROLOL TARTRATE 25 MG PO TABS
25.0000 mg | ORAL_TABLET | Freq: Two times a day (BID) | ORAL | 1 refills | Status: DC
  Filled 2023-03-20: qty 90, 45d supply, fill #0

## 2023-03-21 ENCOUNTER — Other Ambulatory Visit (HOSPITAL_COMMUNITY): Payer: Self-pay

## 2023-03-22 ENCOUNTER — Other Ambulatory Visit: Payer: Self-pay | Admitting: Cardiovascular Disease

## 2023-03-23 ENCOUNTER — Other Ambulatory Visit (HOSPITAL_COMMUNITY): Payer: Self-pay

## 2023-06-21 ENCOUNTER — Other Ambulatory Visit: Payer: Self-pay | Admitting: Cardiovascular Disease

## 2023-08-01 ENCOUNTER — Other Ambulatory Visit: Payer: Self-pay | Admitting: Cardiovascular Disease

## 2023-08-01 NOTE — Telephone Encounter (Signed)
 Prescription refill request for Eliquis  received. Indication:afib Last office visit:10/24 Scr:0.86  2/25 Age: 73 Weight:89.8  kg  Prescription refilled

## 2023-09-14 ENCOUNTER — Ambulatory Visit (INDEPENDENT_AMBULATORY_CARE_PROVIDER_SITE_OTHER)

## 2023-09-14 ENCOUNTER — Encounter: Payer: Self-pay | Admitting: Podiatry

## 2023-09-14 ENCOUNTER — Ambulatory Visit: Admitting: Podiatry

## 2023-09-14 ENCOUNTER — Telehealth: Payer: Self-pay | Admitting: *Deleted

## 2023-09-14 DIAGNOSIS — M722 Plantar fascial fibromatosis: Secondary | ICD-10-CM | POA: Diagnosis not present

## 2023-09-14 DIAGNOSIS — M5416 Radiculopathy, lumbar region: Secondary | ICD-10-CM

## 2023-09-14 MED ORDER — METHYLPREDNISOLONE 4 MG PO TBPK
ORAL_TABLET | ORAL | 0 refills | Status: DC
Start: 2023-09-14 — End: 2023-10-29

## 2023-09-14 NOTE — Progress Notes (Signed)
  Subjective:  Patient ID: Donald Duncan, male    DOB: [birth year],   MRN: [id number]  Chief Complaint  Patient presents with   Foot Pain    I have heel pain on both feet.  They burn.  I can't stand for the covers to lay on my toes.  I get numbness in my big toes.  The right foot is the worst.    73 y.o. male presents for concern as above. HE does have a significant history of back pain and does relate  pain will shoot up his leg and into his grown. Does state pain first steps in morning around heels and after being at work all day.  . Denies any other pedal complaints. Denies n/v/f/c.   Past Medical History:  Diagnosis Date   Atrial fibrillation (HCC)    Colon polyp, hyperplastic    Crohn's colitis (HCC)    Diverticulosis    H/O: iron deficiency anemia    Hemorrhoids    Sleep apnea treated with continuous positive airway pressure (CPAP)     Objective:  Physical Exam: Vascular: DP/PT pulses 2/4 bilateral. CFT <3 seconds. Normal hair growth on digits. No edema.  Skin. No lacerations or abrasions bilateral feet.  Musculoskeletal: MMT 5/5 bilateral lower extremities in DF, PF, Inversion and Eversion. Deceased ROM in DF of ankle joint. Tender to the medial calcaneal tubercle bilaterally . No pain with achilles, PT or arch. No pain with calcaneal squeeze.  Neurological: Sensation intact to light touch. Protective sensatio slightly diminished.   Assessment:   1. Plantar fasciitis, bilateral   2. Lumbar radiculopathy      Plan:  Patient was evaluated and treated and all questions answered. Discussed plantar fasciitis with patient.  X-rays reviewed and discussed with patient. No acute fractures or dislocations noted. Mild spurring noted at inferior calcaneus.  Discussed treatment options including, ice, NSAIDS, supportive shoes, bracing, and stretching. Stretching exercises provided to be done on a daily basis.   Prescription for medrol dose pack provided and sent to pharmacy. PF  brace dispensed bilateral.  Discussed CMO.  Follow-up 6 weeks or sooner if any problems arise. In the meantime, encouraged to call the office with any questions, concerns, change in symptoms.      Asberry Failing, DPM

## 2023-09-14 NOTE — Patient Instructions (Signed)

## 2023-09-14 NOTE — Telephone Encounter (Signed)
 I called and asked the patient to come in a few minutes early.  We need x-rays of his feet.

## 2023-10-02 ENCOUNTER — Ambulatory Visit (INDEPENDENT_AMBULATORY_CARE_PROVIDER_SITE_OTHER)

## 2023-10-02 DIAGNOSIS — M2142 Flat foot [pes planus] (acquired), left foot: Secondary | ICD-10-CM

## 2023-10-02 DIAGNOSIS — M2141 Flat foot [pes planus] (acquired), right foot: Secondary | ICD-10-CM

## 2023-10-02 DIAGNOSIS — M722 Plantar fascial fibromatosis: Secondary | ICD-10-CM

## 2023-10-11 ENCOUNTER — Other Ambulatory Visit: Payer: Self-pay

## 2023-10-11 MED ORDER — METOPROLOL TARTRATE 25 MG PO TABS: 25.0000 mg | ORAL_TABLET | Freq: Two times a day (BID) | ORAL | 2 refills | Status: DC

## 2023-10-23 ENCOUNTER — Telehealth: Payer: Self-pay | Admitting: Gastroenterology

## 2023-10-23 NOTE — Telephone Encounter (Signed)
 Patient will keep his appointment for 9/15 at 8:40

## 2023-10-23 NOTE — Telephone Encounter (Signed)
 Inbound call from patient stating that [facility] was going to be sending over some paperwork for Dr. Abran to sign and send back for patient to continue his Remicade  infusions. Patient is unsure if he needs to be seen with us  or if the paperwork can just be sent back. His next infusion is scheduled for 9/18. Please advise.

## 2023-10-23 NOTE — Telephone Encounter (Signed)
 Pt has not been seen in a year. Scheduled pt to see Alan Coombs PA 10/29/23@8 :40am. Please let pt know about the appt.

## 2023-10-23 NOTE — Telephone Encounter (Signed)
 Spoke with pt, he states he has a big job coming up on Monday and would have to call back to see if he can make appointment.

## 2023-10-26 ENCOUNTER — Ambulatory Visit: Admitting: Physician Assistant

## 2023-10-26 ENCOUNTER — Ambulatory Visit (INDEPENDENT_AMBULATORY_CARE_PROVIDER_SITE_OTHER): Admitting: Podiatry

## 2023-10-26 ENCOUNTER — Encounter: Payer: Self-pay | Admitting: Podiatry

## 2023-10-26 DIAGNOSIS — M722 Plantar fascial fibromatosis: Secondary | ICD-10-CM

## 2023-10-26 NOTE — Progress Notes (Signed)
 10/29/2023 Donald Duncan [id number] 1950-09-06  Referring provider: Frederik Charleston, MD Primary GI doctor: Dr. Abran  ASSESSMENT AND PLAN:  Indeterminate colitis most likely Crohn's disease April 2023, unremarkable other than 1 TA polyp recall 3 years  on Remicade  5 mg/kg's every 8 weeks -Will check CRP, sed rate - will refill remicade  5 mg/kg q 8 weeks - follow up 1 year or sooner if any issues - due for colonoscopy April 2026, recall in place - discussed vaccinations  Atrial fibrillation On Eliquis   Personal history of colon polyps April 2023, unremarkable other than 1 TA polyp recall 3 years  MR No SOB, CP   Patient Care Team: Frederik Charleston, MD as PCP - General (Family Medicine) Burnard Debby LABOR, MD (Inactive) as PCP - Cardiology (Cardiology)  HISTORY OF PRESENT ILLNESS: 73 y.o. male presents for evaluation of indeterminate colitis most likely Crohn's. Last seen in the office on 07/06/2022 by Jessica Zier, PA.   IBD history: Diagnosed 2001 after colonoscopy, diarrhea/rectal bleeding/AB pain. Remicade  5 mg/kg IV every 6 weeks, but increased to every 8 weeks without any issues Has been on solo agent, getting outpatient center in [facility]  Last colonoscopy: April 2023, unremarkable other than 1 TA polyp recall 3 years medication patient was on Remicade  5 mg/kg's every 8 weeks. Last small bowel imaging: 06/09/2016 CT renal stone study showed obstructing stone left ureter, distal colonic diverticulosis, AVN left femoral head coronary atherosclerosis Extraintestinal manifestations: The patient has not had any extraintestinal symptoms Has arthritis Surgical history: no surgery.  Other significant medical history: A-fib on Eliquis  AVN left femoral head-consider vitamin D and DEXA  Current History Discussed the use of AI scribe software for clinical note transcription with the patient, who gave verbal consent to proceed.  History of Present Illness   Donald Duncan  is a 73 year old male with ulcerative colitis who presents for a follow-up regarding his Remicade  treatment.  He has a history of crohn's colitis, diagnosed around 2001, with initial symptoms of diarrhea and rectal bleeding occurring around 1999-2000. He is currently on Remicade  at a dose of 5 mg/kg every eight weeks, after initially starting at every six weeks. He experiences financial strain due to the cost of treatment but managed to adjust the frequency with medical approval. He has regular bowel movements, sometimes multiple times a day.No abdominal pain, blood in stool, constipation, fever, chills, unintentional weight loss, shortness of breath, or chest discomfort.   He has a history of avascular necrosis of the left hip, identified during a renal stone study in 2018. He experiences significant pain when sitting for extended periods, which sometimes causes his hip to 'catch' painfully. He has not pursued further treatment due to concerns about surgery. He has been using foot straps and is awaiting orthotics, which have provided some relief.  He also takes vitamin D and a multivitamin daily.  In terms of social history, he quit smoking in 2000 and consumes alcohol occasionally, with a maximum of four to five beers per week.      Recent labs: Fecal cal declines 09/06/2023 WBC 6.1 HGB 15.1 MCV 92 Platelets 316 09/06/2023 AST 16 ALT 24 Alkphos 75 TBili 0.4  TB GOLD 09/06/2023 TB Gold negative HepBsAG has had vaccinations TPMT Activity: No results  IBD Health Care Maintenance: Annual Flu Vaccine - DUE Pneumococcal Vaccine if receiving immunosuppression: - DUE   TB testing if on anti-TNF, yearly - 08/2023 Vitamin D screening - patient is on vitamin  D COVID vaccine - DUE Shingrix - suggest getting  Immunization History  Administered Date(s) Administered   PFIZER(Purple Top)SARS-COV-2 Vaccination 03/11/2019, 04/01/2019, 10/06/2019    RELEVANT GI HISTORY, LABS, IMAGING:  CBC     Component Value Date/Time   WBC 6.1 07/15/2021 0829   WBC 6.4 04/27/2021 1104   RBC 4.84 07/15/2021 0829   RBC 4.62 04/27/2021 1104   HGB 15.1 07/15/2021 0829   HCT 44.3 07/15/2021 0829   PLT 348 07/15/2021 0829   MCV 92 07/15/2021 0829   MCH 31.2 07/15/2021 0829   MCHC 34.1 07/15/2021 0829   MCHC 34.3 04/27/2021 1104   RDW 11.9 07/15/2021 0829   LYMPHSABS 1.7 04/27/2021 1104   MONOABS 0.5 04/27/2021 1104   EOSABS 0.1 04/27/2021 1104   BASOSABS 0.1 04/27/2021 1104   No results for input(s): HGB in the last 8760 hours.  CMP     Component Value Date/Time   NA 141 09/13/2022 1100   K 4.6 09/13/2022 1100   CL 102 09/13/2022 1100   CO2 26 09/13/2022 1100   GLUCOSE 97 09/13/2022 1100   GLUCOSE 119 (H) 04/27/2021 1104   BUN 15 09/13/2022 1100   CREATININE 0.85 09/13/2022 1100   CALCIUM  9.5 09/13/2022 1100   PROT 6.9 09/13/2022 1100   ALBUMIN 4.7 09/13/2022 1100   AST 26 09/13/2022 1100   ALT 25 09/13/2022 1100   ALKPHOS 95 09/13/2022 1100   BILITOT 0.6 09/13/2022 1100   GFRNONAA 105.94 05/25/2009 0849      Latest Ref Rng & Units 09/13/2022   11:00 AM 12/12/2021    8:49 AM 07/15/2021    8:29 AM  Hepatic Function  Total Protein 6.0 - 8.5 g/dL 6.9  6.8  7.0   Albumin 3.9 - 4.9 g/dL 4.7  4.6  4.5   AST 0 - 40 IU/L 26  20  14    ALT 0 - 44 IU/L 25  25  19    Alk Phosphatase 44 - 121 IU/L 95  91  98   Total Bilirubin 0.0 - 1.2 mg/dL 0.6  0.4  0.3       Current Medications:    Current Outpatient Medications (Cardiovascular):    atorvastatin  (LIPITOR) 20 MG tablet, TAKE 1 TABLET BY MOUTH EVERY DAY   flecainide  (TAMBOCOR ) 50 MG tablet, TAKE 1 TABLET BY MOUTH TWICE A DAY   metoprolol  tartrate (LOPRESSOR ) 25 MG tablet, Take 1 tablet (25 mg total) by mouth 2 (two) times daily.    Current Outpatient Medications (Hematological):    ELIQUIS  5 MG TABS tablet, TAKE 1 TABLET BY MOUTH TWICE A DAY  Current Outpatient Medications (Other):    cholecalciferol (VITAMIN D) 1000  UNITS tablet, Take 1,000 Units by mouth daily.     Multiple Vitamins-Minerals (MULTIVITAMIN WITH MINERALS) tablet, Take 1 tablet by mouth daily.   inFLIXimab  (REMICADE ) 100 MG injection, Inject 500 mg into the vein every 8 (eight) weeks. [facility]   [address]   [address]   [place], [place] [address]   [phone]  Medical History:  Past Medical History:  Diagnosis Date   Atrial fibrillation [facility])    Colon polyp, hyperplastic    Crohn's colitis (HCC)    Diverticulosis    H/O: iron deficiency anemia    Hemorrhoids    Sleep apnea treated with continuous positive airway pressure (CPAP)    Allergies:  Allergies  Allergen Reactions   Codeine Itching     Surgical History:  He  has a past surgical history that includes Finger surgery and Colonoscopy. Family History:  His family history includes Alzheimer's disease in his mother; Heart disease in his father and mother; Lung cancer in his father.  REVIEW OF SYSTEMS  : All other systems reviewed and negative except where noted in the History of Present Illness.  PHYSICAL EXAM: BP (!) 142/72   Pulse 63   Ht 6' 2 (1.88 m)   Wt 196 lb 12.8 oz (89.3 kg)   SpO2 97%   BMI 25.27 kg/m  Physical Exam   GENERAL APPEARANCE: Well nourished, in no apparent distress. HEENT: No cervical lymphadenopathy, unremarkable thyroid, sclerae anicteric, conjunctiva pink. RESPIRATORY: Respiratory effort normal, breath sounds clear to auscultation bilaterally, equal without rales, rhonchi, or wheezing. CARDIO: Regular rate and rhythm with no murmurs, rubs, or gallops, peripheral pulses intact. ABDOMEN: Soft, non-distended, active bowel sounds in all four quadrants, non-tender to palpation, no rebound tenderness, no masses appreciated. RECTAL: Declines. MUSCULOSKELETAL: Full range of motion, normal gait, without edema. SKIN: Dry, intact without rashes or lesions. No jaundice. NEURO: Alert, oriented, no focal  deficits. PSYCH: Cooperative, normal mood and affect.      Alan JONELLE Coombs, PA-C 8:39 AM

## 2023-10-26 NOTE — Progress Notes (Signed)
  Subjective:  Patient ID: Donald Duncan, male    DOB: 02/07/1951,   MRN: [id number]  Chief Complaint  Patient presents with   Plantar Fasciitis    Can honestly say I have seen some improvement but I still have the lower back pain and hip pain that comes and goes.    73 y.o. male presents for follow-up of bilateral plantar fasciitis. Relates the heels are doing better about 50%. Has been doing some stretching and wearing the brace. He does have a significant history of back pain and does relate  pain will shoot up his leg and into his grown.  . Denies any other pedal complaints. Denies n/v/f/c.   Past Medical History:  Diagnosis Date   Atrial fibrillation (HCC)    Colon polyp, hyperplastic    Crohn's colitis (HCC)    Diverticulosis    H/O: iron deficiency anemia    Hemorrhoids    Sleep apnea treated with continuous positive airway pressure (CPAP)     Objective:  Physical Exam: Vascular: DP/PT pulses 2/4 bilateral. CFT <3 seconds. Normal hair growth on digits. No edema.  Skin. No lacerations or abrasions bilateral feet.  Musculoskeletal: MMT 5/5 bilateral lower extremities in DF, PF, Inversion and Eversion. Deceased ROM in DF of ankle joint. Tender to the medial calcaneal tubercle bilaterally . No pain with achilles, PT or arch. No pain with calcaneal squeeze.  Neurological: Sensation intact to light touch. Protective sensatio slightly diminished.   Assessment:   1. Plantar fasciitis, bilateral       Plan:  Patient was evaluated and treated and all questions answered. Discussed plantar fasciitis with patient.  X-rays reviewed and discussed with patient. No acute fractures or dislocations noted. Mild spurring noted at inferior calcaneus.  Discussed treatment options including, ice, NSAIDS, supportive shoes, bracing, and stretching. Stretching exercises provided to be done on a daily basis.   Continue brace and stretching.  Discussed following with orthopedist for hip and  lower back pain.  Discussed CMO.  Follow-up as needed     Asberry Failing, DPM

## 2023-10-29 ENCOUNTER — Ambulatory Visit (INDEPENDENT_AMBULATORY_CARE_PROVIDER_SITE_OTHER): Admitting: Physician Assistant

## 2023-10-29 ENCOUNTER — Ambulatory Visit: Payer: Self-pay | Admitting: Physician Assistant

## 2023-10-29 ENCOUNTER — Telehealth: Payer: Self-pay

## 2023-10-29 ENCOUNTER — Other Ambulatory Visit (INDEPENDENT_AMBULATORY_CARE_PROVIDER_SITE_OTHER)

## 2023-10-29 ENCOUNTER — Encounter: Payer: Self-pay | Admitting: Physician Assistant

## 2023-10-29 VITALS — BP 142/72 | HR 63 | Ht 74.0 in | Wt 196.8 lb

## 2023-10-29 DIAGNOSIS — Z860101 Personal history of adenomatous and serrated colon polyps: Secondary | ICD-10-CM | POA: Diagnosis not present

## 2023-10-29 DIAGNOSIS — I4891 Unspecified atrial fibrillation: Secondary | ICD-10-CM | POA: Diagnosis not present

## 2023-10-29 DIAGNOSIS — K50119 Crohn's disease of large intestine with unspecified complications: Secondary | ICD-10-CM

## 2023-10-29 DIAGNOSIS — I4819 Other persistent atrial fibrillation: Secondary | ICD-10-CM

## 2023-10-29 DIAGNOSIS — I341 Nonrheumatic mitral (valve) prolapse: Secondary | ICD-10-CM

## 2023-10-29 LAB — C-REACTIVE PROTEIN: CRP: <1 mg/dL (ref 0.5–20.0)

## 2023-10-29 LAB — SEDIMENTATION RATE: Sed Rate: 5 mm/h (ref 0–20)

## 2023-10-29 MED ORDER — INFLIXIMAB 100 MG IV SOLR: 5.0000 mg/kg | INTRAVENOUS | Status: AC

## 2023-10-29 NOTE — Progress Notes (Signed)
 Noted

## 2023-10-29 NOTE — Patient Instructions (Signed)
 Your provider has requested that you go to the basement level for lab work before leaving today. Press B on the elevator. The lab is located at the first door on the left as you exit the elevator.  What is Avascular Necrosis (AVN) of the Hip? Avascular necrosis (AVN), also called osteonecrosis, is a condition where the blood supply to the ball part of your hip joint (the femoral head) is reduced or blocked. Without enough blood, the bone tissue dies, which can lead to pain, loss of movement, and eventually collapse of the hip joint.[1-2][6] What Causes AVN of the Hip? AVN can happen for several reasons:  Injury: A broken hip or dislocated hip can damage blood vessels.  Medications: Long-term or high-dose use of corticosteroids (a type of medicine often used for inflammation) is a common cause.[1][5]  Alcohol: Heavy drinking can increase risk.  Medical conditions: Sickle cell disease, blood clotting problems, and some genetic conditions can lead to AVN.[6]  Other factors: Smoking and high cholesterol may also play a role.[5] Sometimes, AVN happens without a clear cause. Who Gets AVN? AVN of the hip most often affects adults between ages 62 and 18, but it can happen at any age. It is more common in people who use corticosteroids, drink heavily, or have certain medical conditions.[5-7] What are the Symptoms?  Hip pain: Usually felt in the groin, thigh, or buttock. It may start mild and get worse over time.  Stiffness: Trouble moving the hip, especially when walking or climbing stairs.  Limping: You may notice you are limping or avoiding putting weight on the leg.  Worsening pain: Pain often gets worse with activity and better with rest.[6] How is AVN Diagnosed? Doctors diagnose AVN by:  Asking about your symptoms and medical history.  Doing a physical exam.  Ordering imaging tests, such as X-rays or MRI scans, to look for changes in the bone.[1-2][6] What are the Stages of AVN? AVN is usually  divided into stages, from early (before the bone collapses) to late (after the bone collapses). Early diagnosis is important because treatment works best before the bone is damaged.[6] How is AVN of the Hip Treated? Treatment depends on the stage of AVN and your overall health. Early-Stage AVN (before bone collapse):  Lifestyle changes: Avoid putting weight on the hip (using crutches or a walker), stopping smoking, and limiting alcohol.[1-2][4]  Medications: Some medicines may help slow the disease, such as blood thinners, statins, or bisphosphonates, but their benefits are still being studied.[4][8]  Physical therapy: Exercises to keep the hip moving and muscles strong.  Newer treatments: Bone marrow stem cell therapy may help repair bone and reduce pain in early AVN, with studies showing improved function and less need for hip replacement.[3] Surgical Options:  Core decompression: A procedure where a small hole is drilled into the bone to relieve pressure and help blood flow. Sometimes, bone grafts or stem cells are added to help healing.[2-4][9]  Osteotomy: Changing the shape of the bone to reduce stress on the damaged area.[10]  Joint-preserving procedures: These are more common in younger patients and may help delay the need for hip replacement.[5] Advanced AVN (after bone collapse):  Total hip replacement (THA): If the bone has collapsed or the joint is badly damaged, replacing the hip joint is often the best option. Modern hip replacements are reliable and can relieve pain and restore movement.[5][7][11] What Can You Do?  Follow your treatment plan: Take medicines as prescribed and attend follow-up visits.  Stay active: Do  recommended exercises to keep your hip strong and flexible.  Ask questions: If you are worried or have pain, talk to your healthcare team.  Take care of your health: Avoid smoking and heavy drinking, and manage other health conditions.

## 2023-10-29 NOTE — Telephone Encounter (Signed)
 Received a call from [facility] [phone]). I was informed that authorization for Remicade  was approved today and patient is able to keep appt later this week for infusion.

## 2023-10-29 NOTE — Progress Notes (Signed)
 Patient was present and evaluated for Custom molded foot orthotics. Patient will benefit from CFO's to provide total contact to BIL MLA's helping to balance and distribute body weight more evenly across BIL feet helping to reduce plantar pressure and pain. Orthotic will also encourage FF / RF alignment  Patient was scanned today and will return for fitting upon receipt  Patient paid $150 towards Orthotics as [facility] will not cover

## 2023-10-29 NOTE — Telephone Encounter (Signed)
-----   Message from Nurse Buckingham B sent at 10/29/2023  8:48 AM EDT -----  ----- Message ----- From: Craig Alan SAUNDERS, PA-C Sent: 10/29/2023   8:38 AM EDT To: [facility] Triage  Can we get his remicaide to [facility]?

## 2023-10-29 NOTE — Telephone Encounter (Signed)
 Patient receives his infusions at [facility] [phone]). I called the infusion center but got the  vm. I left a message asking if there is a formal form that needs to be completed for Remicade  5 mg/kg every 8 weeks renewal. I asked that someone give me a call back with that information as well as a good fax number to send order to. Will await return call.

## 2023-11-05 ENCOUNTER — Ambulatory Visit

## 2023-11-06 NOTE — Progress Notes (Signed)
 Patient presents today to pick up custom molded foot orthotics, diagnosed with PF by Dr. Ralene Cork.   Orthotics were dispensed and fit was satisfactory. Reviewed instructions for break-in and wear. Written instructions given to patient.  Patient will follow up as needed.  Addison Bailey Cped, CFo, CFm

## 2023-12-28 ENCOUNTER — Other Ambulatory Visit: Payer: Self-pay | Admitting: General Practice

## 2023-12-31 ENCOUNTER — Telehealth: Payer: Self-pay | Admitting: Cardiology

## 2023-12-31 MED ORDER — ATORVASTATIN CALCIUM 20 MG PO TABS: 20.0000 mg | ORAL_TABLET | Freq: Every day | ORAL | 0 refills | Status: DC

## 2023-12-31 NOTE — Telephone Encounter (Signed)
 Patient c/o Palpitations:  STAT if patient reporting lightheadedness, shortness of breath, or chest pain  How long have you had palpitations/irregular HR/ Afib? Are you having the symptoms now? No   Are you currently experiencing lightheadedness, SOB or CP? No   Do you have a history of afib (atrial fibrillation) or irregular heart rhythm? Yes   Have you checked your BP or HR? (document readings if available): No   Are you experiencing any other symptoms? No   Pt had an episode of Afib 11/15. He is not having symptoms now, but still wanted to make office aware. Please advise.

## 2024-01-11 ENCOUNTER — Other Ambulatory Visit: Payer: Self-pay | Admitting: Physician Assistant

## 2024-01-27 ENCOUNTER — Other Ambulatory Visit: Payer: Self-pay | Admitting: Physician Assistant

## 2024-01-28 NOTE — Progress Notes (Unsigned)
 Cardiology Office Note    Date:  01/28/2024  ID:  Donald Duncan, Donald Duncan 03-02-1950, MRN [id number] PCP:  Frederik Charleston, MD  Cardiologist:  Debby Sor, MD (Inactive)  Electrophysiologist:  None   Chief Complaint: ***  History of Present Illness: .    Donald Duncan is a 73 y.o. male with visit-pertinent history of persistent atrial fibrillation on flecainide  and Eliquis , mitral valve prolapse with mild mitral valve insufficiency, OSA, hypertension, hyperlipidemia and colitis.  Patient previously followed by Dr. Gary Rinaldo for atrial fibrillation.  Patient with initial episode of atrial fibrillation in 2000 with subsequent recurrent atrial fibrillation in 2012 and possibly 2017.  Patient previously maintained sinus rhythm on flecainide  and metoprolol .  Echocardiogram on 07/21/2021 indicated LVEF 58%, no RWMA, mild LVH of the basal septal segment, G1 DD, RV systolic function size is normal, mild mitral valve regurgitation with no evidence of stenosis, mild holosystolic prolapse of the middle segment of the anterior leaflet of the mitral valve, aortic valve regurgitation was trivial, no stenosis was present.   Patient was last seen in clinic by Dr. Sor on 12/07/2022 for follow-up.  He remained stable from a cardiac standpoint.  He was maintained on flecainide  50 mg twice daily, Toprol  tartrate 25 mg twice daily and Eliquis .  Today he presents for follow-up.  He reports that he  Persistent atrial fibrillation: Initial episode in 2000.  Patient has overall maintained normal sinus rhythm on flecainide  and metoprolol . EKG today indicates Problems on Eliquis .  HTN: Blood pressure today   HLD: Last lipid profile on   OSA:   Labwork independently reviewed:   ROS: .   *** denies chest pain, shortness of breath, lower extremity edema, fatigue, palpitations, melena, hematuria, hemoptysis, diaphoresis, weakness, presyncope, syncope, orthopnea, and PND.  All other systems are reviewed and  otherwise negative.  Studies Reviewed: SABRA    EKG:  EKG is ordered today, personally reviewed, demonstrating ***     CV Studies: Cardiac studies reviewed are outlined and summarized above. Otherwise please see EMR for full report. Cardiac Studies & Procedures   ______________________________________________________________________________________________     ECHOCARDIOGRAM  ECHOCARDIOGRAM COMPLETE 07/21/2021  Narrative ECHOCARDIOGRAM REPORT    Patient Name:   Donald Duncan Date of Exam: 07/21/2021 Medical Rec #:  [id number]     Height:       74.0 in Accession #:    [id number]    Weight:       192.4 lb Date of Birth:  [birth year]     BSA:          2.138 m Patient Age:    71 years      BP:           122/79 mmHg Patient Gender: M             HR:           73 bpm. Exam Location:  [address]  Procedure: 2D Echo, 3D Echo, Cardiac Doppler and Color Doppler  Indications:    I48.91* Unspecified atrial fibrillation  History:        Patient has no prior history of Echocardiogram examinations. Snoring. Anemia.  Sonographer:    Jon Hacker RCS Referring Phys: [id number] THOMAS A KELLY  IMPRESSIONS   1. Left ventricular ejection fraction by 3D volume is 58 %. The left ventricle has normal function. The left ventricle has no regional wall motion abnormalities. There is mild left ventricular hypertrophy of the basal-septal segment. Left ventricular diastolic parameters  are consistent with Grade I diastolic dysfunction (impaired relaxation). 2. Right ventricular systolic function is normal. The right ventricular size is normal. 3. The mitral valve is myxomatous. Mild mitral valve regurgitation. No evidence of mitral stenosis. There is mild holosystolic prolapse of the middle segment of the anterior leaflet of the mitral valve. 4. The aortic valve is normal in structure. Aortic valve regurgitation is trivial. No aortic stenosis is present. 5. Pulmonic valve regurgitation is moderate. 6. The  inferior vena cava is normal in size with greater than 50% respiratory variability, suggesting right atrial pressure of 3 mmHg.  FINDINGS Left Ventricle: Left ventricular ejection fraction by 3D volume is 58 %. The left ventricle has normal function. The left ventricle has no regional wall motion abnormalities. The left ventricular internal cavity size was normal in size. There is mild left ventricular hypertrophy of the basal-septal segment. Left ventricular diastolic parameters are consistent with Grade I diastolic dysfunction (impaired relaxation).  Right Ventricle: The right ventricular size is normal. No increase in right ventricular wall thickness. Right ventricular systolic function is normal.  Left Atrium: Left atrial size was normal in size.  Right Atrium: Right atrial size was normal in size.  Pericardium: There is no evidence of pericardial effusion.  Mitral Valve: The mitral valve is myxomatous. There is mild holosystolic prolapse of the middle segment of the anterior leaflet of the mitral valve. There is moderate thickening of the mitral valve leaflet(s). Mild mitral valve regurgitation. No evidence of mitral valve stenosis.  Tricuspid Valve: The tricuspid valve is normal in structure. Tricuspid valve regurgitation is not demonstrated. No evidence of tricuspid stenosis.  Aortic Valve: The aortic valve is normal in structure. Aortic valve regurgitation is trivial. No aortic stenosis is present.  Pulmonic Valve: The pulmonic valve was normal in structure. Pulmonic valve regurgitation is moderate. No evidence of pulmonic stenosis.  Aorta: The aortic root is normal in size and structure.  Venous: The inferior vena cava is normal in size with greater than 50% respiratory variability, suggesting right atrial pressure of 3 mmHg.  IAS/Shunts: No atrial level shunt detected by color flow Doppler.   LEFT VENTRICLE PLAX 2D LVIDd:         3.60 cm LVIDs:         2.70 cm LV PW:          1.00 cm         3D Volume EF LV IVS:        1.20 cm         LV 3D EF:    Left LVOT diam:     2.30 cm                      ventricul LV SV:         92                           ar LV SV Index:   43                           ejection LVOT Area:     4.15 cm                     fraction by 3D volume is 58 %.  3D Volume EF: 3D EF:        58 % LV EDV:  138 ml LV ESV:       58 ml LV SV:        80 ml  RIGHT VENTRICLE RV Basal diam:  2.90 cm TAPSE (M-mode): 2.1 cm  LEFT ATRIUM             Index        RIGHT ATRIUM           Index LA diam:        3.10 cm 1.45 cm/m   RA Area:     14.50 cm LA Vol (A2C):   43.8 ml 20.49 ml/m  RA Volume:   32.00 ml  14.97 ml/m LA Vol (A4C):   50.2 ml 23.48 ml/m LA Biplane Vol: 47.8 ml 22.36 ml/m AORTIC VALVE LVOT Vmax:   110.00 cm/s LVOT Vmean:  62.300 cm/s LVOT VTI:    0.222 m  AORTA Ao Root diam: 3.20 cm Ao Asc diam:  3.20 cm  MITRAL VALVE MV Area (PHT): 3.89 cm    SHUNTS MV Decel Time: 195 msec    Systemic VTI:  0.22 m MV E velocity: 64.90 cm/s  Systemic Diam: 2.30 cm MV A velocity: 74.30 cm/s MV E/A ratio:  0.87  Donald Parchment MD Electronically signed by Donald Parchment MD Signature Date/Time: 07/21/2021/1:20:41 PM    Final          ______________________________________________________________________________________________       Current Reported Medications:.    Active Medications[1]  Physical Exam:    VS:  There were no vitals taken for this visit.   Wt Readings from Last 3 Encounters:  10/29/23 196 lb 12.8 oz (89.3 kg)  12/07/22 198 lb (89.8 kg)  09/13/22 196 lb 12.8 oz (89.3 kg)    GEN: Well nourished, well developed in no acute distress NECK: No JVD; No carotid bruits CARDIAC: ***RRR, no murmurs, rubs, gallops RESPIRATORY:  Clear to auscultation without rales, wheezing or rhonchi  ABDOMEN: Soft, non-tender, non-distended EXTREMITIES:  No edema; No acute deformity     Asessement and Plan:.     ***      Disposition: F/u with ***  Signed, Laprecious Austill D Leeba Barbe, NP      [1]  No outpatient medications have been marked as taking for the 02/01/24 encounter (Appointment) with Briannia Laba D, NP.

## 2024-01-30 ENCOUNTER — Other Ambulatory Visit: Payer: Self-pay | Admitting: General Practice

## 2024-01-30 MED ORDER — APIXABAN 5 MG PO TABS: 5.0000 mg | ORAL_TABLET | Freq: Two times a day (BID) | ORAL | 5 refills | Status: AC

## 2024-01-30 NOTE — Telephone Encounter (Signed)
 Prescription refill request for Eliquis  received. Indication:afib Last office visit:upcoming Scr: 0.90  7/25 Age:73 Weight:89.3  kg  Prescription refilled

## 2024-02-01 ENCOUNTER — Encounter: Payer: Self-pay | Admitting: Cardiology

## 2024-02-01 ENCOUNTER — Ambulatory Visit: Attending: Cardiology | Admitting: Cardiology

## 2024-02-01 VITALS — BP 130/70 | HR 63 | Ht 74.0 in | Wt 192.8 lb

## 2024-02-01 DIAGNOSIS — E782 Mixed hyperlipidemia: Secondary | ICD-10-CM | POA: Insufficient documentation

## 2024-02-01 DIAGNOSIS — I10 Essential (primary) hypertension: Secondary | ICD-10-CM | POA: Insufficient documentation

## 2024-02-01 DIAGNOSIS — I4819 Other persistent atrial fibrillation: Secondary | ICD-10-CM | POA: Insufficient documentation

## 2024-02-01 DIAGNOSIS — I341 Nonrheumatic mitral (valve) prolapse: Secondary | ICD-10-CM | POA: Insufficient documentation

## 2024-02-01 DIAGNOSIS — G4733 Obstructive sleep apnea (adult) (pediatric): Secondary | ICD-10-CM | POA: Diagnosis not present

## 2024-02-01 DIAGNOSIS — I340 Nonrheumatic mitral (valve) insufficiency: Secondary | ICD-10-CM | POA: Insufficient documentation

## 2024-02-01 NOTE — Patient Instructions (Addendum)
 Thank you for choosing [facility]!     Medication Instructions:  No medication changes were made during today's visit.    PCP providers link [email or web address]  A referral for A-fib clinic has been placed for you  *If you need a refill on your cardiac medications before your next appointment, please call your pharmacy*   Lab Work: CBC, CMET, TSH, T3-T4, LIPID, MAG If you have labs (blood work) drawn today and your tests are completely normal, you will receive your results only by: MyChart Message (if you have MyChart) OR A paper copy in the mail If you have any lab test that is abnormal or we need to change your treatment, we will call you to review the results.   Testing/Procedures: Your physician has requested that you have an echocardiogram. Echocardiography is a painless test that uses sound waves to create images of your heart. It provides your doctor with information about the size and shape of your heart and how well your hearts chambers and valves are working. This procedure takes approximately one hour. There are no restrictions for this procedure.  Please do NOT wear cologne, perfume, aftershave, or lotions (deodorant is allowed).  Please arrive 15 minutes prior to your appointment time.   Your next appointment:   6 month(s)   Provider:  ANY AVAILABLE MD IN [place]      Follow-Up: At [facility], you and your health needs are our priority.  As part of our continuing mission to provide you with exceptional heart care, we have created designated Provider Care Teams.  These Care Teams include your primary Cardiologist (physician) and Advanced Practice Providers (APPs -  Physician Assistants and Nurse Practitioners) who all work together to provide you with the care you need, when you need it. We recommend signing up for the  patient portal called MyChart.  Sign up information is provided on this After Visit Summary.  MyChart is used to connect with patients for Virtual Visits (Telemedicine).  Patients are able to view lab/test results, encounter notes, upcoming appointments, etc.  Non-urgent messages can be sent to your provider as well.   To learn more about what you can do with MyChart, go to [email or web address].

## 2024-02-05 ENCOUNTER — Telehealth: Payer: Self-pay | Admitting: Cardiology

## 2024-02-05 NOTE — Telephone Encounter (Signed)
 Pt wanted to clarify appt instructions. Is he supposed to establish new gen card in [place] and establish with afib clinic or only establish with afib clinic? Please advise.

## 2024-02-05 NOTE — Telephone Encounter (Signed)
 Left VM for patient to call back and schedule an appointment in [place] with general cardiologist. CB

## 2024-02-14 ENCOUNTER — Other Ambulatory Visit: Payer: Self-pay | Admitting: Physician Assistant

## 2024-02-15 ENCOUNTER — Other Ambulatory Visit: Payer: Self-pay | Admitting: Cardiology

## 2024-02-15 MED ORDER — FLECAINIDE ACETATE 50 MG PO TABS: 50.0000 mg | ORAL_TABLET | Freq: Two times a day (BID) | ORAL | 3 refills | Status: AC

## 2024-02-22 LAB — CBC
Hematocrit: 46.2 % (ref 37.5–51.0)
Hemoglobin: 15 g/dL (ref 13.0–17.7)
MCH: 31.6 pg (ref 26.6–33.0)
MCHC: 32.5 g/dL (ref 31.5–35.7)
MCV: 97 fL (ref 79–97)
Platelets: 366 x10E3/uL (ref 150–450)
RBC: 4.75 x10E6/uL (ref 4.14–5.80)
RDW: 11.6 % (ref 11.6–15.4)
WBC: 6.9 x10E3/uL (ref 3.4–10.8)

## 2024-02-22 LAB — LIPID PANEL
Chol/HDL Ratio: 2.3 ratio (ref 0.0–5.0)
Cholesterol, Total: 129 mg/dL (ref 100–199)
HDL: 55 mg/dL
LDL Chol Calc (NIH): 49 mg/dL (ref 0–99)
Triglycerides: 151 mg/dL — ABNORMAL HIGH (ref 0–149)
VLDL Cholesterol Cal: 25 mg/dL (ref 5–40)

## 2024-02-22 LAB — COMPREHENSIVE METABOLIC PANEL WITH GFR
ALT: 22 IU/L (ref 0–44)
AST: 20 IU/L (ref 0–40)
Albumin: 4.6 g/dL (ref 3.8–4.8)
Alkaline Phosphatase: 95 IU/L (ref 47–123)
BUN/Creatinine Ratio: 17 (ref 10–24)
BUN: 14 mg/dL (ref 8–27)
Bilirubin Total: 0.3 mg/dL (ref 0.0–1.2)
CO2: 26 mmol/L (ref 20–29)
Calcium: 9.7 mg/dL (ref 8.6–10.2)
Chloride: 102 mmol/L (ref 96–106)
Creatinine, Ser: 0.84 mg/dL (ref 0.76–1.27)
Globulin, Total: 2.2 g/dL (ref 1.5–4.5)
Glucose: 118 mg/dL — ABNORMAL HIGH (ref 70–99)
Potassium: 4.3 mmol/L (ref 3.5–5.2)
Sodium: 143 mmol/L (ref 134–144)
Total Protein: 6.8 g/dL (ref 6.0–8.5)
eGFR: 93 mL/min/1.73

## 2024-02-22 LAB — MAGNESIUM: Magnesium: 2.2 mg/dL (ref 1.6–2.3)

## 2024-02-22 LAB — TSH+T4F+T3FREE
Free T4: 1.07 ng/dL (ref 0.82–1.77)
T3, Free: 3.9 pg/mL (ref 2.0–4.4)
TSH: 2.44 u[IU]/mL (ref 0.450–4.500)

## 2024-02-23 ENCOUNTER — Other Ambulatory Visit: Payer: Self-pay | Admitting: Cardiology

## 2024-02-24 ENCOUNTER — Ambulatory Visit: Payer: Self-pay | Admitting: Cardiology

## 2024-02-25 ENCOUNTER — Ambulatory Visit (HOSPITAL_BASED_OUTPATIENT_CLINIC_OR_DEPARTMENT_OTHER)
Admission: RE | Admit: 2024-02-25 | Discharge: 2024-02-25 | Disposition: A | Discharge status: Home / Self Care | Source: Ambulatory Visit | Attending: Cardiology | Admitting: Cardiology

## 2024-02-25 DIAGNOSIS — I341 Nonrheumatic mitral (valve) prolapse: Secondary | ICD-10-CM | POA: Insufficient documentation

## 2024-02-25 LAB — ECHOCARDIOGRAM COMPLETE
AR max vel: 2.65 cm2
AV Area VTI: 2.39 cm2
AV Area mean vel: 2.58 cm2
AV Mean grad: 4 mmHg
AV Peak grad: 7.7 mmHg
Ao pk vel: 1.39 m/s
Area-P 1/2: 3.5 cm2
Calc EF: 70.1 %
MV M vel: 5.31 m/s
MV Peak grad: 112.8 mmHg
S' Lateral: 1.9 cm
Single Plane A2C EF: 69 %
Single Plane A4C EF: 69.8 %

## 2024-03-26 ENCOUNTER — Ambulatory Visit: Admitting: Cardiology

## 2024-08-12 ENCOUNTER — Ambulatory Visit (HOSPITAL_COMMUNITY): Admitting: Nurse Practitioner
